# Patient Record
Sex: Female | Born: 1945 | Race: Black or African American | Hispanic: No | Smoking: Never smoker
Health system: Southern US, Community
[De-identification: ages and names within clinical notes are randomized; demographics above are authoritative.]

## PROBLEM LIST (undated history)

## (undated) ENCOUNTER — Emergency Department: Admission: EM | Payer: Medicare Other | Source: Home / Self Care

## (undated) DIAGNOSIS — C801 Malignant (primary) neoplasm, unspecified: Secondary | ICD-10-CM

## (undated) DIAGNOSIS — M1990 Unspecified osteoarthritis, unspecified site: Secondary | ICD-10-CM

## (undated) DIAGNOSIS — I519 Heart disease, unspecified: Secondary | ICD-10-CM

## (undated) DIAGNOSIS — I10 Essential (primary) hypertension: Secondary | ICD-10-CM

## (undated) DIAGNOSIS — E785 Hyperlipidemia, unspecified: Secondary | ICD-10-CM

## (undated) DIAGNOSIS — N189 Chronic kidney disease, unspecified: Secondary | ICD-10-CM

## (undated) HISTORY — PX: OTHER SURGICAL HISTORY: SHX169

## (undated) HISTORY — PX: TUBAL LIGATION: SHX77

---

## 2005-08-26 ENCOUNTER — Ambulatory Visit: Payer: Self-pay | Admitting: Cardiovascular Disease

## 2005-09-29 ENCOUNTER — Ambulatory Visit: Payer: Self-pay | Admitting: Internal Medicine

## 2006-02-24 ENCOUNTER — Ambulatory Visit: Payer: Self-pay | Admitting: Gastroenterology

## 2007-01-31 ENCOUNTER — Ambulatory Visit: Payer: Self-pay | Admitting: Internal Medicine

## 2007-05-10 ENCOUNTER — Emergency Department: Payer: Self-pay | Admitting: Emergency Medicine

## 2008-03-05 ENCOUNTER — Ambulatory Visit: Payer: Self-pay | Admitting: Internal Medicine

## 2009-03-07 ENCOUNTER — Ambulatory Visit: Payer: Self-pay | Admitting: Internal Medicine

## 2009-12-31 ENCOUNTER — Ambulatory Visit: Payer: Self-pay | Admitting: Gastroenterology

## 2010-04-17 ENCOUNTER — Ambulatory Visit: Payer: Self-pay | Admitting: Internal Medicine

## 2011-02-24 ENCOUNTER — Ambulatory Visit: Payer: Self-pay | Admitting: General Practice

## 2011-06-30 ENCOUNTER — Ambulatory Visit: Payer: Self-pay | Admitting: Rheumatology

## 2012-02-29 ENCOUNTER — Ambulatory Visit: Payer: Self-pay | Admitting: General Practice

## 2013-08-21 ENCOUNTER — Encounter: Payer: Self-pay | Admitting: *Deleted

## 2013-09-03 ENCOUNTER — Ambulatory Visit: Payer: Self-pay | Admitting: General Surgery

## 2013-09-05 ENCOUNTER — Ambulatory Visit: Payer: Self-pay | Admitting: General Surgery

## 2013-10-04 ENCOUNTER — Encounter: Payer: Self-pay | Admitting: *Deleted

## 2014-11-12 ENCOUNTER — Other Ambulatory Visit: Payer: Self-pay | Admitting: *Deleted

## 2014-11-12 ENCOUNTER — Encounter: Payer: Self-pay | Admitting: Podiatry

## 2014-11-12 ENCOUNTER — Telehealth: Payer: Self-pay | Admitting: *Deleted

## 2014-11-12 ENCOUNTER — Ambulatory Visit (INDEPENDENT_AMBULATORY_CARE_PROVIDER_SITE_OTHER): Payer: Medicare Other | Admitting: Podiatry

## 2014-11-12 ENCOUNTER — Ambulatory Visit (INDEPENDENT_AMBULATORY_CARE_PROVIDER_SITE_OTHER): Payer: Medicare Other

## 2014-11-12 VITALS — BP 151/83 | HR 73 | Resp 16 | Ht 65.0 in | Wt 213.0 lb

## 2014-11-12 DIAGNOSIS — M7672 Peroneal tendinitis, left leg: Secondary | ICD-10-CM

## 2014-11-12 DIAGNOSIS — B353 Tinea pedis: Secondary | ICD-10-CM

## 2014-11-12 DIAGNOSIS — L84 Corns and callosities: Secondary | ICD-10-CM

## 2014-11-12 DIAGNOSIS — M779 Enthesopathy, unspecified: Secondary | ICD-10-CM

## 2014-11-12 MED ORDER — NAFTIFINE HCL 1 % EX CREA
TOPICAL_CREAM | Freq: Every day | CUTANEOUS | Status: DC
Start: 2014-11-12 — End: 2015-08-27

## 2014-11-12 MED ORDER — LULICONAZOLE 1 % EX CREA: 1.0000 "application " | TOPICAL_CREAM | Freq: Every day | CUTANEOUS | Status: DC

## 2014-11-12 NOTE — Patient Instructions (Addendum)
Peroneal Tendinitis with Rehab Tendonitis is inflammation of a tendon. Inflammation of the tendons on the back of the outer ankle (peroneal tendons) is known as peroneal tendonitis. The peroneal tendons are responsible for connecting the muscles that allow you to stand on your tiptoes to the bones of the ankle. For this reason, peroneal tendonitis often causes pain when trying to complete such motions. Peroneal tendonitis often involves a tear (strain) of the peroneal tendons. Strains are classified into three categories. Grade 1 strains cause pain, but the tendon is not lengthened. Grade 2 strains include a lengthened ligament, due to the ligament being stretched or partially ruptured. With grade 2 strains there is still function, although function may be decreased. Grade 3 strains involve a complete tear of the tendon or muscle, and function is usually impaired. SYMPTOMS   Pain, tenderness, swelling, warmth, or redness over the back of the outer side of the ankle, the outer part of the mid-foot, or the bottom of the arch.  Pain that gets worse with ankle motion (especially when pushing off or pushing down with the front of the foot), or when standing on the ball of the foot or pushing the foot outward.  Crackling sound (crepitation) when the tendon is moved or touched. CAUSES  Peroneal tendinitis occurs when injury to the peroneal tendons causes the body to respond with inflammation. Common causes of injury include:  An overuse injury, in which the groove behind the outer ankle (where the tendon is located) causes wear on the tendon.  A sudden stress placed on the tendon, such as from an increase in the intensity, frequency, or duration of training.  Direct hit (trauma) to the tendon.  Return to activity too soon after a previous ankle injury. RISK INCREASES WITH:  Sports that require sudden, repetitive pushing off of the foot, such as jumping or quick starts.  Kicking and running sports,  especially running down hills or long distances.  Poor strength and flexibility.  Previous injury to the foot, ankle, or leg. PREVENTION  Warm up and stretch properly before activity.  Allow for adequate recovery between workouts.  Maintain physical fitness:  Strength, flexibility, and endurance.  Cardiovascular fitness.  Complete rehabilitation after previous injury. PROGNOSIS  If treated properly, peroneal tendonitis usually heals within 6 weeks.  RELATED COMPLICATIONS  Longer healing time, if not properly treated or if not given enough time to heal.  Recurring symptoms if activity is resumed too soon, with overuse, or when using poor technique.  If untreated, tendinitis may result in tendon rupture, requiring surgery. TREATMENT  Treatment first involves the use of ice and medicine to reduce pain and inflammation. The use of strengthening and stretching exercises may help reduce pain with activity. These exercises may be performed at home or with a therapist. Sometimes, the foot and ankle will be restrained for 10 to 14 days to promote healing. Your caregiver may advise that you place a heel lift in your shoes to reduce the stress placed on the tendon. If nonsurgical treatment is unsuccessful, surgery to remove the inflamed tendon lining (sheath) may be advised.  MEDICATION   If pain medicine is needed, nonsteroidal anti-inflammatory medicines (aspirin and ibuprofen), or other minor pain relievers (acetaminophen), are often advised.  Do not take pain medicine for 7 days before surgery.  Prescription pain relievers may be given, if your caregiver thinks they are needed. Use only as directed and only as much as you need. HEAT AND COLD  Cold treatment (icing) should   be applied for 10 to 15 minutes every 2 to 3 hours for inflammation and pain, and immediately after activity that aggravates your symptoms. Use ice packs or an ice massage.  Heat treatment may be used before  performing stretching and strengthening activities prescribed by your caregiver, physical therapist, or athletic trainer. Use a heat pack or a warm water soak. SEEK MEDICAL CARE IF:  Symptoms get worse or do not improve in 2 to 4 weeks, despite treatment.  New, unexplained symptoms develop. (Drugs used in treatment may produce side effects.) EXERCISES RANGE OF MOTION (ROM) AND STRETCHING EXERCISES - Peroneal Tendinitis These exercises may help you when beginning to rehabilitate your injury. Your symptoms may resolve with or without further involvement from your physician, physical therapist or athletic trainer. While completing these exercises, remember:   Restoring tissue flexibility helps normal motion to return to the joints. This allows healthier, less painful movement and activity.  An effective stretch should be held for at least 30 seconds.  A stretch should never be painful. You should only feel a gentle lengthening or release in the stretched tissue. RANGE OF MOTION - Ankle Eversion  Sit with your right / left ankle crossed over your opposite knee.  Grip your foot with your opposite hand, placing your thumb on the top of your foot and your fingers across the bottom of your foot.  Gently push your foot downward with a slight rotation, so your littlest toes rise slightly toward the ceiling.  You should feel a gentle stretch on the inside of your ankle. Hold the stretch for __________ seconds. Repeat __________ times. Complete this exercise __________ times per day.  RANGE OF MOTION - Ankle Inversion  Sit with your right / left ankle crossed over your opposite knee.  Grip your foot with your opposite hand, placing your thumb on the bottom of your foot and your fingers across the top of your foot.  Gently pull your foot so the smallest toe comes toward you and your thumb pushes the inside of the ball of your foot away from you.  You should feel a gentle stretch on the outside of  your ankle. Hold the stretch for __________ seconds. Repeat __________ times. Complete this exercise __________ times per day.  RANGE OF MOTION - Ankle Plantar Flexion  Sit with your right / left leg crossed over your opposite knee.  Use your opposite hand to pull the top of your foot and toes toward you.  You should feel a gentle stretch on the top of your foot and ankle. Hold this position for __________ seconds. Repeat __________ times. Complete __________ times per day.  STRETCH - Gastroc, Standing  Place your hands on a wall.  Extend your right / left leg behind you, keeping the front knee somewhat bent.  Slightly point your toes inward on your back foot.  Keeping your right / left heel on the floor and your knee straight, shift your weight toward the wall, not allowing your back to arch.  You should feel a gentle stretch in the calf. Hold this position for __________ seconds. Repeat __________ times. Complete this stretch __________ times per day. STRETCH - Soleus, Standing  Place your hands on a wall.  Extend your right / left leg behind you, keeping the other knee somewhat bent.  Slightly point your toes inward on your back foot.  Keep your heel on the floor, bend your back knee, and slightly shift your weight over the back leg so that   you feel a gentle stretch deep in your back calf.  Hold this position for __________ seconds. Repeat __________ times. Complete this stretch __________ times per day. STRETCH - Gastrocsoleus, Standing Note: This exercise can place a lot of stress on your foot and ankle. Please complete this exercise only if specifically instructed by your caregiver.   Place the ball of your right / left foot on a step, keeping your other foot firmly on the same step.  Hold on to the wall or a rail for balance.  Slowly lift your other foot, allowing your body weight to press your heel down over the edge of the step.  You should feel a stretch in your  right / left calf.  Hold this position for __________ seconds.  Repeat this exercise with a slight bend in your knee. Repeat __________ times. Complete this stretch __________ times per day.  STRENGTHENING EXERCISES - Peroneal Tendinitis  These exercises may help you when beginning to rehabilitate your injury. They may resolve your symptoms with or without further involvement from your physician, physical therapist or athletic trainer. While completing these exercises, remember:   Muscles can gain both the endurance and the strength needed for everyday activities through controlled exercises.  Complete these exercises as instructed by your physician, physical therapist or athletic trainer. Increase the resistance and repetitions only as guided by your caregiver. STRENGTH - Dorsiflexors  Secure a rubber exercise band or tubing to a fixed object (table, pole) and loop the other end around your right / left foot.  Sit on the floor facing the fixed object. The band should be slightly tense when your foot is relaxed.  Slowly draw your foot back toward you, using your ankle and toes.  Hold this position for __________ seconds. Slowly release the tension in the band and return your foot to the starting position. Repeat __________ times. Complete this exercise __________ times per day.  STRENGTH - Towel Curls  Sit in a chair, on a non-carpeted surface.  Place your foot on a towel, keeping your heel on the floor.  Pull the towel toward your heel only by curling your toes. Keep your heel on the floor.  If instructed by your physician, physical therapist or athletic trainer, add weight to the end of the towel. Repeat __________ times. Complete this exercise __________ times per day. STRENGTH - Ankle Eversion   Secure one end of a rubber exercise band or tubing to a fixed object (table, pole). Loop the other end around your foot, just before your toes.  Place your fists between your knees.  This will focus your strengthening at your ankle.  Drawing the band across your opposite foot, away from the pole, slowly, pull your little toe out and up. Make sure the band is positioned to resist the entire motion.  Hold this position for __________ seconds.  Have your muscles resist the band, as it slowly pulls your foot back to the starting position. Repeat __________ times. Complete this exercise __________ times per day.  Document Released: 12/06/2005 Document Revised: 04/22/2014 Document Reviewed: 03/20/2009 [facility] Patient Information 2015 [place], [place]. This information is not intended to replace advice given to you by your health care provider. Make sure you discuss any questions you have with your health care provider. Athlete's Foot Athlete's foot (tinea pedis) is a fungal infection of the skin on the feet. It often occurs on the skin between the toes or underneath the toes. It can also occur on the soles of  the feet. Athlete's foot is more likely to occur in hot, humid weather. Not washing your feet or changing your socks often enough can contribute to athlete's foot. The infection can spread from person to person (contagious). CAUSES Athlete's foot is caused by a fungus. This fungus thrives in warm, moist places. Most people get athlete's foot by sharing shower stalls, towels, and wet floors with an infected person. People with weakened immune systems, including those with diabetes, may be more likely to get athlete's foot. SYMPTOMS   Itchy areas between the toes or on the soles of the feet.  White, flaky, or scaly areas between the toes or on the soles of the feet.  Tiny, intensely itchy blisters between the toes or on the soles of the feet.  Tiny cuts on the skin. These cuts can develop a bacterial infection.  Thick or discolored toenails. DIAGNOSIS  Your caregiver can usually tell what the problem is by doing a physical exam. Your caregiver may also take a skin sample  from the rash area. The skin sample may be examined under a microscope, or it may be tested to see if fungus will grow in the sample. A sample may also be taken from your toenail for testing. TREATMENT  Over-the-counter and prescription medicines can be used to kill the fungus. These medicines are available as powders or creams. Your caregiver can suggest medicines for you. Fungal infections respond slowly to treatment. You may need to continue using your medicine for several weeks. PREVENTION   Do not share towels.  Wear sandals in wet areas, such as shared locker rooms and shared showers.  Keep your feet dry. Wear shoes that allow air to circulate. Wear cotton or wool socks. HOME CARE INSTRUCTIONS   Take medicines as directed by your caregiver. Do not use steroid creams on athlete's foot.  Keep your feet clean and cool. Wash your feet daily and dry them thoroughly, especially between your toes.  Change your socks every day. Wear cotton or wool socks. In hot climates, you may need to change your socks 2 to 3 times per day.  Wear sandals or canvas tennis shoes with good air circulation.  If you have blisters, soak your feet in Burow's solution or Epsom salts for 20 to 30 minutes, 2 times a day to dry out the blisters. Make sure you dry your feet thoroughly afterward. SEEK MEDICAL CARE IF:   You have a fever.  You have swelling, soreness, warmth, or redness in your foot.  You are not getting better after 7 days of treatment.  You are not completely cured after 30 days.  You have any problems caused by your medicines. MAKE SURE YOU:   Understand these instructions.  Will watch your condition.  Will get help right away if you are not doing well or get worse. Document Released: 12/03/2000 Document Revised: 02/28/2012 Document Reviewed: 09/24/2011 [facility] Patient Information 2015 [place], [place]. This information is not intended to replace advice given to you by your health care  provider. Make sure you discuss any questions you have with your health care provider.

## 2014-11-12 NOTE — Telephone Encounter (Signed)
You can prescribe naftin cream. Thanks.

## 2014-11-12 NOTE — Telephone Encounter (Signed)
Pt called and stated the luliconazole cream after her ins paid, left her with $167.00 to pay. What would you like to prescribe her next?

## 2014-11-12 NOTE — Progress Notes (Signed)
   Subjective:    Patient ID: Cristina Roberts, female    DOB: April 02, 1946, 68 y.o.   MRN: [id number]  HPI Comments: 68 year old female presents the office today with complaints of cracking/itching to the bottom of her feet. She states that she has tried multiple over-the-counter antifungals without much resolution in symptoms. This has been ongoing for approximately 3 weeks. She has states that she has pain to her left foot intermittently. When asking the localized the pain she points the fifth metatarsal base area. She states that she has pain in this area after prolonged activity/ambulation. There is no pain with direct pressure or shoe gear. She denies any recent injury or trauma to the area. She also states that she has a corn on the right fifth toe. No other complaints at this time.  Foot Pain      Review of Systems  Eyes: Positive for itching.  Musculoskeletal:       Joint pain   All other systems reviewed and are negative.      Objective:   Physical Exam AAO 3, NAD DP/PT pulses palpable bilaterally, CRT less than 3 seconds Protective sensation intact with Simms Weinstein monofilament, vibratory sensation intact, Achilles tendon reflex intact. There is evidence of dry, scaling, pealing skin on the plantar aspect of bilateral feet with slight erythematous base consistent with tinea pedis. There is no open lesions, fissuring, bleeding. No clinical signs of infection. On the right foot on the medial aspect of the fifth digit there is a small hyperkeratotic lesion with underlying adductovarus deformity of the fifth digit. There is no tenderness to palpation overlying the digit. Subjectively the patient has pain to this area with pressure/shoes. On the left foot there is mild tenderness to palpation of the fifth metatarsal base at the insertion of the peroneal tendon. There is no pain with vibratory sensation for overlying edema, erythema, increase in warmth. There is no pain with  range of motion. There is no pain along the course of the peroneal tendons and they appear to be intact. Decrease in medial arch height upon weightbearing. MMT 5/5, ROM WNL No pain with calf compression, swelling, warmth, erythema. No open lesions.       Assessment & Plan:  68 year old female with bilateral tinea pedis, left insertional peroneal tendinitis, right fifth digit hyperkeratotic lesion -Treatment options were discussed including alternatives, risks, complications. -X-rays were obtained and reviewed of the left foot. There is no acute fractures identified. Pain is most likely result of insertional peroneal tendinitis. Discussed with the patient various stretching exercises, ice to the area. Also discussed possible steroid injection to the area however she wishes to hold off at this time. Discussed that symptoms continue can pursue this at next appointment. Anti-inflammatories as needed. -For tinea pedis prescribed luzu cream. Patient later called stating was too expensive there for Naftin was prescribed.  -Hyperkeratotic lesion right medial fifth digit sharply debrided without complications without any underlying ulceration. Dispensed offloading pads. -Follow-up in 4 weeks. In the meantime, call the office with any questions, concerns, change in symptoms.

## 2014-11-13 DIAGNOSIS — B353 Tinea pedis: Secondary | ICD-10-CM | POA: Insufficient documentation

## 2014-11-13 DIAGNOSIS — M7670 Peroneal tendinitis, unspecified leg: Secondary | ICD-10-CM | POA: Insufficient documentation

## 2014-12-17 ENCOUNTER — Ambulatory Visit: Payer: Medicare Other | Admitting: Podiatry

## 2015-01-02 ENCOUNTER — Ambulatory Visit: Payer: Medicare Other | Admitting: Podiatry

## 2015-02-05 ENCOUNTER — Ambulatory Visit: Payer: Self-pay | Admitting: Family Medicine

## 2015-08-27 ENCOUNTER — Encounter
Admission: RE | Admit: 2015-08-27 | Discharge: 2015-08-27 | Disposition: A | Discharge status: Home / Self Care | Payer: Medicare Other | Source: Ambulatory Visit | Attending: Specialist | Admitting: Specialist

## 2015-08-27 DIAGNOSIS — Z01810 Encounter for preprocedural cardiovascular examination: Secondary | ICD-10-CM | POA: Insufficient documentation

## 2015-08-27 DIAGNOSIS — Z01812 Encounter for preprocedural laboratory examination: Secondary | ICD-10-CM | POA: Insufficient documentation

## 2015-08-27 HISTORY — DX: Essential (primary) hypertension: I10

## 2015-08-27 HISTORY — DX: Unspecified osteoarthritis, unspecified site: M19.90

## 2015-08-27 LAB — BASIC METABOLIC PANEL
Anion gap: 7 (ref 5–15)
BUN: 13 mg/dL (ref 6–20)
CHLORIDE: 102 mmol/L (ref 101–111)
CO2: 29 mmol/L (ref 22–32)
Calcium: 9.8 mg/dL (ref 8.9–10.3)
Creatinine, Ser: 0.68 mg/dL (ref 0.44–1.00)
GFR calc Af Amer: >60 mL/min (ref >60)
GFR calc non Af Amer: >60 mL/min (ref >60)
GLUCOSE: 92 mg/dL (ref 65–99)
POTASSIUM: 3.5 mmol/L (ref 3.5–5.1)
Sodium: 138 mmol/L (ref 135–145)

## 2015-08-27 LAB — URINALYSIS COMPLETE WITH MICROSCOPIC (ARMC ONLY)
BILIRUBIN URINE: NEGATIVE
GLUCOSE, UA: NEGATIVE mg/dL
Hgb urine dipstick: NEGATIVE
Ketones, ur: NEGATIVE mg/dL
Leukocytes, UA: NEGATIVE
Nitrite: NEGATIVE
Protein, ur: NEGATIVE mg/dL
Specific Gravity, Urine: 1.018 (ref 1.005–1.030)
pH: 5 (ref 5.0–8.0)

## 2015-08-27 LAB — CBC
HCT: 43.4 % (ref 35.0–47.0)
Hemoglobin: 14.3 g/dL (ref 12.0–16.0)
MCH: 29.8 pg (ref 26.0–34.0)
MCHC: 32.9 g/dL (ref 32.0–36.0)
MCV: 90.4 fL (ref 80.0–100.0)
Platelets: 218 10*3/uL (ref 150–440)
RBC: 4.81 MIL/uL (ref 3.80–5.20)
RDW: 14 % (ref 11.5–14.5)
WBC: 5.9 10*3/uL (ref 3.6–11.0)

## 2015-08-27 LAB — ABO/RH: ABO/RH(D): O POS

## 2015-08-27 LAB — PROTIME-INR
INR: 0.94
Prothrombin Time: 12.8 seconds (ref 11.4–15.0)

## 2015-08-27 LAB — SURGICAL PCR SCREEN
MRSA, PCR: NEGATIVE
STAPHYLOCOCCUS AUREUS: NEGATIVE

## 2015-08-27 LAB — TYPE AND SCREEN
ABO/RH(D): O POS
Antibody Screen: NEGATIVE

## 2015-08-27 NOTE — Patient Instructions (Addendum)
Your procedure is scheduled on: Wednesday 09/10/2015 Report to Day Surgery. 2ND FLOOR MEDICAL MALL ENTRANCE To find out your arrival time please call [phone] between 1PM - 3PM on Tuesday 09/09/2015.  Remember: Instructions that are not followed completely may result in serious medical risk, up to and including death, or upon the discretion of your surgeon and anesthesiologist your surgery may need to be rescheduled.    __X__ 1. Do not eat food or drink liquids after midnight. No gum chewing or hard candies.     __X__ 2. No Alcohol for 24 hours before or after surgery.   ____ 3. Bring all medications with you on the day of surgery if instructed.    __X__ 4. Notify your doctor if there is any change in your medical condition     (cold, fever, infections).     Do not wear jewelry, make-up, hairpins, clips or nail polish.  Do not wear lotions, powders, or perfumes.  Do not shave 48 hours prior to surgery. Men may shave face and neck.  Do not bring valuables to the hospital.    [facility] is not responsible for any belongings or valuables.               Contacts, dentures or bridgework may not be worn into surgery.  Leave your suitcase in the car. After surgery it may be brought to your room.  For patients admitted to the hospital, discharge time is determined by your                treatment team.   Patients discharged the day of surgery will not be allowed to drive home.   Please read over the following fact sheets that you were given:   MRSA Information and Surgical Site Infection Prevention   __X__ Take these medicines the morning of surgery with A SIP OF WATER:    1. AMLODIPINE  2.   3.   4.  5.  6.  ____ Fleet Enema (as directed)   __X__ Use CHG Soap as directed  ____ Use inhalers on the day of surgery  ____ Stop metformin 2 days prior to surgery    ____ Take 1/2 of usual insulin dose the night before surgery and none on the morning of surgery.   ____ Stop  Coumadin/Plavix/aspirin on   __X__ Stop Anti-inflammatories on 09/03/2015 STOP YOUR DICLOFENAC, YOU MAY SUBSTITUTE TYLENOL  FOR ACHES OR PAIN   ____ Stop supplements until after surgery.    ____ Bring C-Pap to the hospital.   Spinal Anesthesia Anesthesia is medicine to make you comfortable during surgery or a procedure. There are many types of anesthesia. Spinal anesthesia is performed when a medication is injected into the fluid surrounding the spinal cord. This deadens the nerves coming out of the spinal cord from the level the needle is inserted on down. In other words, you temporarily will not be able to move your legs or feel them. You may be made numb from your nipples on down. How far up you are numb depends on the type of surgery being done. Spinal anesthesia begins working almost immediately after the injection of a medication into the spinal canal. Epidural anesthesia takes 10-20 minutes to begin working. The only discomfort from this type of anesthesia is the little stick of the needle into your back to put the medicine into the spinal canal. This is really not much different than having your blood drawn. REASON FOR RECEIVING SPINAL OR EPIDURAL  ANESTHESIA  Spinal anesthesia is often used in surgeries of the pelvis, hips, and legs and lower abdomen.  Epidural anesthesia is used for pain relief during childbirth and after major abdominal or chest surgery. ADVANTAGES OF SPINAL AND EPIDURAL ANESTHESIA INCLUDE:  The ability to be awake but without discomfort during the operation.  To avoid problems of general anesthesia such as:  Nausea.  Vomiting.  Sore throat.  Less blood loss during surgery.  Better pain control after surgery.  Lower incidence of lower extremity blood clots (DVTs) following surgery. RISK FACTORS THAT MAY CAUSE PROBLEMS  If you have a bleeding disorder or are on blood-thinning medicines.  If you have had previous bad or allergic reactions to  anesthetics.  If you have an immune system disorder.  If you have a localized infection on your lower back or a systemic infections throughout your body. LET YOUR CAREGIVER KNOW ABOUT:  Your drug allergies.  The medications you are taking. Include herbal, vitamin supplements and all over-the-counter medications.  Any medical problems you have including heart or lung conditions.  Any use of steroids.  Any recent alcohol, cigarette or illicit drug use.  Any previous reactions that you or other family members have had to anesthesia (especially to local anesthetics).  Any bleeding or clotting problems you have had in the past. RISKS AND COMPLICATIONS  Other than a spinal headache, bad reactions to spinal anesthesia are uncommon. Your caregiver can discuss these with you. Some complications may include:  Severe headache.  Drop in blood pressure which could lead to heart attack or stroke.  Nerve damage (extremely uncommon).  Infection.  Allergic reaction to the anesthetic used.  Seizures.  If the anesthesia goes so high it paralyzes your respiratory muscles you may be unable to breathe on your own. If this happens, your caregiver may have to put a tube into your trachea (the main breathing tube into the lungs), and put you on a machine to breathe for you until the anesthetic wears off.  You could have long-lasting numbness, pain or loss of function of body parts. PROCEDURE   You will be connected to various monitors to keep track of your blood pressure, pulse, and the amount of oxygen in your blood.  Caregivers will either have you lie on your side with your knees and chin bent toward your chest, or may have you sit up on the bed or table. This position opens up the spaces between your back bones and makes it easier for the needle to be placed.  Prior to getting spinal or epidural anesthesia, the area of your back where the medicine is injected will be cleaned with a soap-like  solution. You may be given an injection of local anesthetic directly over the spot where the spinal or epidural anesthetic will be given, to decrease discomfort from the needle used to give the spinal or epidural anesthetic.  A needle is put between the bones of your back. Stay as still and quiet as you can and continue to breathe normally. Tell your anesthesia caregiver if you feel a tingling shock or pain going down a leg, but try not to move.  When your caregiver has the needle in the right place, medicine is injected. Spinal anesthesia involves a single injection of medication into the sac of fluid that surrounds your spinal cord.  Spinal and epidural anesthesia may cause low blood pressure. An IV (intravenous line) is usually started to give fluids and treat blood pressure problems if they  show up during the procedure. You may also be given medications though this line to relax you before the anesthetic is given.  Depending on how long the surgery will take, you may have a drainage tube(Foley catheter) put into your bladder to keep your urine drained.  Epidural anesthesia may be given as a single injection just outside of the sac of fluid that surrounds your spinal cord. When more than one dose of epidural anesthesia might be required, the anesthetist will leave a tiny, flexible tube or catheter in place outside of the fluid sac surrounding your spinal cord. With this catheter in place, more anesthetic can be given easily if the operation takes longer than expected. Some surgical centers leave the epidural tube in place for 24 hours or more after surgery, using it to give pain medications during the immediate post-surgical period.  After the injection of spinal or epidural anesthesia has been completed, or after the epidural catheter has been removed, a small bandage will be placed over the area where the needle was removed. AFTER THE PROCEDURE   After spinal anesthesia, you will be kept in bed  for several hours.  You will be kept in bed until your legs are no longer numb and it is considered safe for you to walk. This numbness usually wears off in about one to four hours. This depends on the medication and dose used.  The length of time you stay in the hospital will depend on the surgery you are having or have had.  If you received epidural anesthesia, and you need pain medicine after surgery, the epidural catheter may be left in place in order to continue supplying small doses of numbing anesthesia. The epidural catheter is removed when it is no longer needed. HOME CARE INSTRUCTIONS  Do not drive or operate machinery for at least 24 hours after anesthesia. Make sure someone can drive you home.  Do not drink alcohol for at least 24 hours after receiving anesthesia.  Do not make important decisions for 24 hours after having spinal or epidural anesthesia. Your thinking may not be clear.  Have someone stay with you for at least 24 to 48 hours following surgery.  Drink lots of fluids when you get home. If you are an adult, drink eight, 8 ounce glasses of water every day, or as directed. SEEK IMMEDIATE MEDICAL CARE IF:  You have a fever or any temperature over 98.6 F (37 C).  You have persistent or severe headaches.  You have dizziness, fainting or lightheadedness.  You have weakness, numbness, or tingling in your arms or legs.  You have a skin rash.  You have difficulty breathing.  You have persistent nausea & vomiting.  You are unable to pass urine. Document Released: 02/26/2004 Document Revised: 02/28/2012 Document Reviewed: 01/09/2008 [facility] Patient Information 2015 [place], [place]. This information is not intended to replace advice given to you by your health care provider. Make sure you discuss any questions you have with your health care provider.

## 2015-08-28 NOTE — OR Nursing (Signed)
Faxed UA results to Dr Ammie Ferrier office for review

## 2015-09-01 NOTE — OR Nursing (Signed)
ekg ok by Dr Gildardo Griffes

## 2015-09-09 NOTE — H&P (Signed)
Cristina Roberts is an 69 y.o. female.   Chief Complaint: Right hip pain HPI: This 69 y/o female has been treated for severe right hip arthritis for several years with medication, exercise, and rest as needed.  She is unable to continue working as she wishes and has pain with activity and at rest.  X-rays show severe OA right hip with loss of joint space, sclerosis, and spurring.She desires right THR to relieve this. The risks and benefits and post op protocols have been discussed in depth with her and she wishes to proceed.   Past Medical History  Diagnosis Date  . Hypertension   . Arthritis     Past Surgical History  Procedure Laterality Date  . Fatty tumor removed    . Tubal ligation      No family history on file. Social History:  reports that she has never smoked. She has never used smokeless tobacco. She reports that she does not drink alcohol or use illicit drugs.  Allergies: No Known Allergies  No prescriptions prior to admission    No results found for this or any previous visit (from the past 48 hour(s)). No results found.  Review of Systems  Constitutional: Negative.   HENT: Negative.   Eyes: Negative.   Respiratory: Negative.   Cardiovascular: Negative.   Gastrointestinal: Negative.   Genitourinary: Negative.   Musculoskeletal: Positive for joint pain.  Skin: Negative.   Neurological: Negative.   Endo/Heme/Allergies: Negative.   Psychiatric/Behavioral: Negative.     There were no vitals taken for this visit. Physical Exam  Constitutional: She appears well-developed and well-nourished.  HENT:  Head: Normocephalic.  Eyes: Pupils are equal, round, and reactive to light.  Neck: Normal range of motion. Neck supple.  Cardiovascular: Normal rate and regular rhythm.   Respiratory: Effort normal.  GI: Soft.  Musculoskeletal:       Right hip: She exhibits decreased range of motion, decreased strength and tenderness.  Neurological: She is alert. She has normal  reflexes.  Skin: Skin is warm and intact.  Psychiatric: She has a normal mood and affect. Her speech is normal and behavior is normal.     Assessment/Plan Advanced right hip arthritis:  Plan total hip replacement  MILLER,HOWARD E 09/09/2015, 4:26 PM

## 2015-09-10 ENCOUNTER — Inpatient Hospital Stay: Payer: Medicare Other

## 2015-09-10 ENCOUNTER — Inpatient Hospital Stay
Admission: RE | Admit: 2015-09-10 | Discharge: 2015-09-13 | DRG: 470 | Disposition: A | Discharge status: Skilled Nursing Facility | Payer: Medicare Other | Source: Ambulatory Visit | Attending: Specialist | Admitting: Specialist

## 2015-09-10 ENCOUNTER — Encounter
Admission: RE | Disposition: A | Discharge status: Skilled Nursing Facility | Payer: Self-pay | Source: Ambulatory Visit | Attending: Specialist

## 2015-09-10 ENCOUNTER — Inpatient Hospital Stay: Payer: Medicare Other | Admitting: Anesthesiology

## 2015-09-10 ENCOUNTER — Encounter: Payer: Self-pay | Admitting: *Deleted

## 2015-09-10 DIAGNOSIS — E669 Obesity, unspecified: Secondary | ICD-10-CM | POA: Diagnosis present

## 2015-09-10 DIAGNOSIS — I10 Essential (primary) hypertension: Secondary | ICD-10-CM | POA: Diagnosis present

## 2015-09-10 DIAGNOSIS — M1611 Unilateral primary osteoarthritis, right hip: Secondary | ICD-10-CM | POA: Diagnosis present

## 2015-09-10 DIAGNOSIS — Z6836 Body mass index (BMI) 36.0-36.9, adult: Secondary | ICD-10-CM

## 2015-09-10 DIAGNOSIS — Z96649 Presence of unspecified artificial hip joint: Secondary | ICD-10-CM

## 2015-09-10 HISTORY — PX: TOTAL HIP ARTHROPLASTY: SHX124

## 2015-09-10 LAB — CREATININE, SERUM
CREATININE: 0.74 mg/dL (ref 0.44–1.00)
GFR calc non Af Amer: >60 mL/min (ref >60)

## 2015-09-10 LAB — CBC
HCT: 40.6 % (ref 35.0–47.0)
HEMOGLOBIN: 13.5 g/dL (ref 12.0–16.0)
MCH: 30.3 pg (ref 26.0–34.0)
MCHC: 33.2 g/dL (ref 32.0–36.0)
MCV: 91.2 fL (ref 80.0–100.0)
PLATELETS: 200 10*3/uL (ref 150–440)
RBC: 4.45 MIL/uL (ref 3.80–5.20)
RDW: 13.9 % (ref 11.5–14.5)
WBC: 14.8 10*3/uL — AB (ref 3.6–11.0)

## 2015-09-10 SURGERY — ARTHROPLASTY, HIP, TOTAL,POSTERIOR APPROACH
Anesthesia: Spinal | Site: Hip | Laterality: Right | Wound class: Clean

## 2015-09-10 MED ORDER — CEFAZOLIN SODIUM-DEXTROSE 2-3 GM-% IV SOLR
INTRAVENOUS | Status: AC
  Filled 2015-09-10: qty 50

## 2015-09-10 MED ORDER — NEOMYCIN-POLYMYXIN B GU 40-200000 IR SOLN
Status: DC | PRN
  Administered 2015-09-10: 16 mL

## 2015-09-10 MED ORDER — CELECOXIB 200 MG PO CAPS
400.0000 mg | ORAL_CAPSULE | Freq: Once | ORAL | Status: AC
  Administered 2015-09-10: 400 mg via ORAL

## 2015-09-10 MED ORDER — FLEET ENEMA 7-19 GM/118ML RE ENEM: 1.0000 | ENEMA | Freq: Once | RECTAL | Status: DC | PRN

## 2015-09-10 MED ORDER — HYDROMORPHONE HCL 1 MG/ML IJ SOLN
0.5000 mg | INTRAMUSCULAR | Status: DC | PRN
  Administered 2015-09-10: 0.5 mg via INTRAVENOUS

## 2015-09-10 MED ORDER — FAMOTIDINE 20 MG PO TABS
20.0000 mg | ORAL_TABLET | Freq: Once | ORAL | Status: AC
  Administered 2015-09-10: 20 mg via ORAL

## 2015-09-10 MED ORDER — METHOCARBAMOL 1000 MG/10ML IJ SOLN: 500.0000 mg | Freq: Four times a day (QID) | INTRAVENOUS | Status: DC | PRN

## 2015-09-10 MED ORDER — FAMOTIDINE 20 MG PO TABS
ORAL_TABLET | ORAL | Status: AC
  Administered 2015-09-10: 20 mg via ORAL
  Filled 2015-09-10: qty 1

## 2015-09-10 MED ORDER — MIDAZOLAM HCL 5 MG/5ML IJ SOLN
INTRAMUSCULAR | Status: DC | PRN
Start: 2015-09-10 — End: 2015-09-10
  Administered 2015-09-10 (×2): 1 mg via INTRAVENOUS

## 2015-09-10 MED ORDER — HYDROMORPHONE HCL 1 MG/ML IJ SOLN
INTRAMUSCULAR | Status: AC
  Administered 2015-09-10: 0.5 mg via INTRAVENOUS
  Filled 2015-09-10: qty 1

## 2015-09-10 MED ORDER — SODIUM CHLORIDE 0.9 % IJ SOLN
INTRAMUSCULAR | Status: AC
  Filled 2015-09-10: qty 50

## 2015-09-10 MED ORDER — METOCLOPRAMIDE HCL 5 MG/ML IJ SOLN
5.0000 mg | Freq: Three times a day (TID) | INTRAMUSCULAR | Status: DC | PRN
Start: 2015-09-10 — End: 2015-09-13

## 2015-09-10 MED ORDER — ACETAMINOPHEN 325 MG PO TABS: 650.0000 mg | ORAL_TABLET | Freq: Four times a day (QID) | ORAL | Status: DC | PRN

## 2015-09-10 MED ORDER — MORPHINE SULFATE (PF) 2 MG/ML IV SOLN
2.0000 mg | INTRAVENOUS | Status: DC | PRN
Start: 2015-09-10 — End: 2015-09-13
  Administered 2015-09-10: 2 mg via INTRAVENOUS
  Filled 2015-09-10: qty 1

## 2015-09-10 MED ORDER — ZOLPIDEM TARTRATE 5 MG PO TABS: 5.0000 mg | ORAL_TABLET | Freq: Every evening | ORAL | Status: DC | PRN

## 2015-09-10 MED ORDER — FENTANYL CITRATE (PF) 100 MCG/2ML IJ SOLN
INTRAMUSCULAR | Status: DC | PRN
  Administered 2015-09-10 (×2): 25 ug via INTRAVENOUS

## 2015-09-10 MED ORDER — PREGABALIN 75 MG PO CAPS
ORAL_CAPSULE | ORAL | Status: AC
  Administered 2015-09-10: 75 mg via ORAL
  Filled 2015-09-10: qty 1

## 2015-09-10 MED ORDER — CLINDAMYCIN PHOSPHATE 600 MG/50ML IV SOLN
INTRAVENOUS | Status: AC
  Filled 2015-09-10: qty 50

## 2015-09-10 MED ORDER — ACETAMINOPHEN 500 MG PO TABS: 1000.0000 mg | ORAL_TABLET | Freq: Four times a day (QID) | ORAL | Status: DC

## 2015-09-10 MED ORDER — DICLOFENAC SODIUM 75 MG PO TBEC: 75.0000 mg | DELAYED_RELEASE_TABLET | Freq: Two times a day (BID) | ORAL | Status: DC

## 2015-09-10 MED ORDER — ONDANSETRON HCL 4 MG/2ML IJ SOLN: 4.0000 mg | Freq: Once | INTRAMUSCULAR | Status: DC | PRN

## 2015-09-10 MED ORDER — HYDROCHLOROTHIAZIDE 25 MG PO TABS
25.0000 mg | ORAL_TABLET | Freq: Every day | ORAL | Status: DC
  Administered 2015-09-10 – 2015-09-13 (×3): 25 mg via ORAL
  Filled 2015-09-10 (×3): qty 1

## 2015-09-10 MED ORDER — ACETAMINOPHEN 650 MG RE SUPP: 650.0000 mg | Freq: Four times a day (QID) | RECTAL | Status: DC | PRN

## 2015-09-10 MED ORDER — TRANEXAMIC ACID 1000 MG/10ML IV SOLN
1000.0000 mg | INTRAVENOUS | Status: AC
  Administered 2015-09-10: 1000 mg via INTRAVENOUS

## 2015-09-10 MED ORDER — BENAZEPRIL HCL 20 MG PO TABS
20.0000 mg | ORAL_TABLET | Freq: Every day | ORAL | Status: DC
  Administered 2015-09-10 – 2015-09-13 (×3): 20 mg via ORAL
  Filled 2015-09-10 (×3): qty 1

## 2015-09-10 MED ORDER — BUPIVACAINE-EPINEPHRINE (PF) 0.5% -1:200000 IJ SOLN
INTRAMUSCULAR | Status: AC
  Filled 2015-09-10: qty 30

## 2015-09-10 MED ORDER — BUPIVACAINE-EPINEPHRINE (PF) 0.25% -1:200000 IJ SOLN
INTRAMUSCULAR | Status: AC
  Filled 2015-09-10: qty 30

## 2015-09-10 MED ORDER — SODIUM CHLORIDE 0.9 % IJ SOLN
INTRAMUSCULAR | Status: AC
  Filled 2015-09-10: qty 3

## 2015-09-10 MED ORDER — CALCIUM CARBONATE-VITAMIN D 500-200 MG-UNIT PO TABS
1.0000 | ORAL_TABLET | Freq: Every day | ORAL | Status: DC
  Administered 2015-09-10 – 2015-09-13 (×4): 1 via ORAL
  Filled 2015-09-10 (×4): qty 1

## 2015-09-10 MED ORDER — AMLODIPINE BESYLATE 5 MG PO TABS
5.0000 mg | ORAL_TABLET | Freq: Every day | ORAL | Status: DC
  Administered 2015-09-12 – 2015-09-13 (×2): 5 mg via ORAL
  Filled 2015-09-10 (×2): qty 1

## 2015-09-10 MED ORDER — SENNA 8.6 MG PO TABS
1.0000 | ORAL_TABLET | Freq: Two times a day (BID) | ORAL | Status: DC
  Administered 2015-09-10 – 2015-09-13 (×7): 8.6 mg via ORAL
  Filled 2015-09-10 (×7): qty 1

## 2015-09-10 MED ORDER — ALUM & MAG HYDROXIDE-SIMETH 200-200-20 MG/5ML PO SUSP: 30.0000 mL | ORAL | Status: DC | PRN

## 2015-09-10 MED ORDER — FENTANYL CITRATE (PF) 100 MCG/2ML IJ SOLN
INTRAMUSCULAR | Status: AC
  Administered 2015-09-10: 25 ug via INTRAVENOUS
  Filled 2015-09-10: qty 2

## 2015-09-10 MED ORDER — BISACODYL 10 MG RE SUPP: 10.0000 mg | Freq: Every day | RECTAL | Status: DC | PRN

## 2015-09-10 MED ORDER — CELECOXIB 200 MG PO CAPS
ORAL_CAPSULE | ORAL | Status: AC
  Administered 2015-09-10: 400 mg via ORAL
  Filled 2015-09-10: qty 2

## 2015-09-10 MED ORDER — TRANEXAMIC ACID 1000 MG/10ML IV SOLN
INTRAVENOUS | Status: AC
  Filled 2015-09-10: qty 10

## 2015-09-10 MED ORDER — METHOCARBAMOL 500 MG PO TABS: 500.0000 mg | ORAL_TABLET | Freq: Four times a day (QID) | ORAL | Status: DC | PRN

## 2015-09-10 MED ORDER — NEOMYCIN-POLYMYXIN B GU 40-200000 IR SOLN
Status: AC
Start: 2015-09-10 — End: 2015-09-10
  Filled 2015-09-10: qty 20

## 2015-09-10 MED ORDER — CLINDAMYCIN PHOSPHATE 600 MG/50ML IV SOLN
600.0000 mg | Freq: Three times a day (TID) | INTRAVENOUS | Status: AC
  Administered 2015-09-10 – 2015-09-11 (×3): 600 mg via INTRAVENOUS
  Filled 2015-09-10 (×3): qty 50

## 2015-09-10 MED ORDER — MAGNESIUM HYDROXIDE 400 MG/5ML PO SUSP: 30.0000 mL | Freq: Every day | ORAL | Status: DC | PRN

## 2015-09-10 MED ORDER — PREGABALIN 75 MG PO CAPS
75.0000 mg | ORAL_CAPSULE | Freq: Once | ORAL | Status: AC
  Administered 2015-09-10: 75 mg via ORAL

## 2015-09-10 MED ORDER — ONDANSETRON HCL 4 MG/2ML IJ SOLN
4.0000 mg | Freq: Four times a day (QID) | INTRAMUSCULAR | Status: DC | PRN
  Administered 2015-09-10: 4 mg via INTRAVENOUS
  Filled 2015-09-10: qty 2

## 2015-09-10 MED ORDER — TRANEXAMIC ACID 1000 MG/10ML IV SOLN
1000.0000 mg | INTRAVENOUS | Status: AC
  Administered 2015-09-10: 1000 mg via INTRAVENOUS
  Filled 2015-09-10: qty 10

## 2015-09-10 MED ORDER — BUPIVACAINE LIPOSOME 1.3 % IJ SUSP
INTRAMUSCULAR | Status: AC
  Filled 2015-09-10: qty 20

## 2015-09-10 MED ORDER — HYDROCODONE-ACETAMINOPHEN 10-325 MG PO TABS
1.0000 | ORAL_TABLET | ORAL | Status: DC | PRN
  Administered 2015-09-10 – 2015-09-11 (×3): 1 via ORAL
  Administered 2015-09-11 (×2): 2 via ORAL
  Administered 2015-09-11 – 2015-09-12 (×4): 1 via ORAL
  Administered 2015-09-13 (×2): 2 via ORAL
  Filled 2015-09-10: qty 1
  Filled 2015-09-10 (×2): qty 2
  Filled 2015-09-10 (×4): qty 1
  Filled 2015-09-10 (×2): qty 2
  Filled 2015-09-10 (×3): qty 1

## 2015-09-10 MED ORDER — ACETAMINOPHEN 500 MG PO TABS: 1000.0000 mg | ORAL_TABLET | Freq: Four times a day (QID) | ORAL | Status: DC | PRN

## 2015-09-10 MED ORDER — ENOXAPARIN SODIUM 40 MG/0.4ML ~~LOC~~ SOLN
40.0000 mg | SUBCUTANEOUS | Status: DC
  Administered 2015-09-11 – 2015-09-13 (×3): 40 mg via SUBCUTANEOUS
  Filled 2015-09-10 (×3): qty 0.4

## 2015-09-10 MED ORDER — BENAZEPRIL-HYDROCHLOROTHIAZIDE 20-25 MG PO TABS: 1.0000 | ORAL_TABLET | Freq: Every day | ORAL | Status: DC

## 2015-09-10 MED ORDER — FERROUS SULFATE 325 (65 FE) MG PO TABS
325.0000 mg | ORAL_TABLET | Freq: Every day | ORAL | Status: DC
  Administered 2015-09-11 – 2015-09-13 (×3): 325 mg via ORAL
  Filled 2015-09-10 (×3): qty 1

## 2015-09-10 MED ORDER — CELECOXIB 200 MG PO CAPS
200.0000 mg | ORAL_CAPSULE | Freq: Two times a day (BID) | ORAL | Status: DC
  Administered 2015-09-10 – 2015-09-13 (×6): 200 mg via ORAL
  Filled 2015-09-10 (×7): qty 1

## 2015-09-10 MED ORDER — BUPIVACAINE-EPINEPHRINE (PF) 0.25% -1:200000 IJ SOLN
INTRAMUSCULAR | Status: DC | PRN
  Administered 2015-09-10: 30 mL

## 2015-09-10 MED ORDER — DIPHENHYDRAMINE HCL 12.5 MG/5ML PO ELIX: 12.5000 mg | ORAL_SOLUTION | ORAL | Status: DC | PRN

## 2015-09-10 MED ORDER — SODIUM CHLORIDE 0.45 % IV SOLN
INTRAVENOUS | Status: DC
  Administered 2015-09-10 – 2015-09-11 (×2): via INTRAVENOUS

## 2015-09-10 MED ORDER — LACTATED RINGERS IV SOLN
INTRAVENOUS | Status: DC
  Administered 2015-09-10: 07:00:00 via INTRAVENOUS

## 2015-09-10 MED ORDER — METOCLOPRAMIDE HCL 5 MG PO TABS: 5.0000 mg | ORAL_TABLET | Freq: Three times a day (TID) | ORAL | Status: DC | PRN

## 2015-09-10 MED ORDER — PROPOFOL INFUSION 10 MG/ML OPTIME
INTRAVENOUS | Status: DC | PRN
  Administered 2015-09-10: 25 ug/kg/min via INTRAVENOUS

## 2015-09-10 MED ORDER — SODIUM CHLORIDE 0.9 % IV SOLN
10000.0000 ug | INTRAVENOUS | Status: DC | PRN
  Administered 2015-09-10: 10 ug/min via INTRAVENOUS

## 2015-09-10 MED ORDER — FENTANYL CITRATE (PF) 100 MCG/2ML IJ SOLN
25.0000 ug | INTRAMUSCULAR | Status: AC | PRN
  Administered 2015-09-10 (×6): 25 ug via INTRAVENOUS

## 2015-09-10 MED ORDER — MENTHOL 3 MG MT LOZG: 1.0000 | LOZENGE | OROMUCOSAL | Status: DC | PRN

## 2015-09-10 MED ORDER — PREGABALIN 75 MG PO CAPS
75.0000 mg | ORAL_CAPSULE | Freq: Two times a day (BID) | ORAL | Status: DC
  Administered 2015-09-10 – 2015-09-13 (×7): 75 mg via ORAL
  Filled 2015-09-10 (×7): qty 1

## 2015-09-10 MED ORDER — CEFAZOLIN SODIUM-DEXTROSE 2-3 GM-% IV SOLR
2.0000 g | Freq: Four times a day (QID) | INTRAVENOUS | Status: AC
  Administered 2015-09-10 (×2): 2 g via INTRAVENOUS
  Filled 2015-09-10 (×3): qty 50

## 2015-09-10 MED ORDER — CEFAZOLIN SODIUM-DEXTROSE 2-3 GM-% IV SOLR
2.0000 g | Freq: Once | INTRAVENOUS | Status: AC
  Administered 2015-09-10: 2 g via INTRAVENOUS

## 2015-09-10 MED ORDER — PHENYLEPHRINE HCL 10 MG/ML IJ SOLN
INTRAMUSCULAR | Status: DC | PRN
  Administered 2015-09-10 (×5): 100 ug via INTRAVENOUS

## 2015-09-10 MED ORDER — BUPIVACAINE IN DEXTROSE 0.75-8.25 % IT SOLN
INTRATHECAL | Status: DC | PRN
  Administered 2015-09-10: 12.5 mL via INTRATHECAL

## 2015-09-10 MED ORDER — CLINDAMYCIN PHOSPHATE 600 MG/50ML IV SOLN
600.0000 mg | Freq: Once | INTRAVENOUS | Status: AC
  Administered 2015-09-10: 600 mg via INTRAVENOUS

## 2015-09-10 MED ORDER — PHENOL 1.4 % MT LIQD: 1.0000 | OROMUCOSAL | Status: DC | PRN

## 2015-09-10 MED ORDER — HYDROCODONE-ACETAMINOPHEN 7.5-325 MG PO TABS: 1.0000 | ORAL_TABLET | Freq: Four times a day (QID) | ORAL | Status: DC

## 2015-09-10 MED ORDER — BUPIVACAINE LIPOSOME 1.3 % IJ SUSP
INTRAMUSCULAR | Status: DC | PRN
  Administered 2015-09-10: 60 mL

## 2015-09-10 MED ORDER — ONDANSETRON HCL 4 MG PO TABS: 4.0000 mg | ORAL_TABLET | Freq: Four times a day (QID) | ORAL | Status: DC | PRN

## 2015-09-10 SURGICAL SUPPLY — 55 items
AUTOTRANSFUS HAS 1/8 (MISCELLANEOUS) ×3
BAG COUNTER SPONGE EZ (MISCELLANEOUS) IMPLANT
BLADE DEBAKEY 8.0 (BLADE) ×2 IMPLANT
BLADE DEBAKEY 8.0MM (BLADE) ×1
BLADE SAGITTAL WIDE XTHICK NO (BLADE) ×3 IMPLANT
BRUSH STANDARD PREP 14M (MISCELLANEOUS) IMPLANT
BUR EGG ELITE 5.0 (BURR) ×2 IMPLANT
BUR EGG ELITE 5.0MM (BURR) ×1
CANISTER SUCT 1200ML W/VALVE (MISCELLANEOUS) ×3 IMPLANT
CANISTER SUCT 3000ML (MISCELLANEOUS) ×3 IMPLANT
CAPT HIP TOTAL 2 ×3 IMPLANT
CATH TRAY METER 16FR LF (MISCELLANEOUS) ×3 IMPLANT
CHLORAPREP W/TINT 26ML (MISCELLANEOUS) ×6 IMPLANT
COUNTER SPONGE BAG EZ (MISCELLANEOUS)
DRAPE INCISE IOBAN 66X60 STRL (DRAPES) ×3 IMPLANT
DRAPE SHEET LG 3/4 BI-LAMINATE (DRAPES) IMPLANT
DRAPE TABLE BACK 80X90 (DRAPES) ×3 IMPLANT
DRSG AQUACEL AG ADV 3.5X10 (GAUZE/BANDAGES/DRESSINGS) IMPLANT
DRSG AQUACEL AG ADV 3.5X14 (GAUZE/BANDAGES/DRESSINGS) ×3 IMPLANT
ELECT BLADE 6 FLAT ULTRCLN (ELECTRODE) ×3 IMPLANT
GAUZE PETRO XEROFOAM 1X8 (MISCELLANEOUS) ×3 IMPLANT
GAUZE SPONGE 4X4 12PLY STRL (GAUZE/BANDAGES/DRESSINGS) ×3 IMPLANT
GLOVE INDICATOR 8.0 STRL GRN (GLOVE) ×3 IMPLANT
GLOVE SURG ORTHO 8.5 STRL (GLOVE) ×3 IMPLANT
GOWN STRL REUS W/ TWL LRG LVL3 (GOWN DISPOSABLE) ×2 IMPLANT
GOWN STRL REUS W/ TWL XL LVL3 (GOWN DISPOSABLE) ×1 IMPLANT
GOWN STRL REUS W/TWL LRG LVL3 (GOWN DISPOSABLE) ×4
GOWN STRL REUS W/TWL XL LVL3 (GOWN DISPOSABLE) ×2
HANDPIECE SUCTION TUBG SURGILV (MISCELLANEOUS) ×3 IMPLANT
KIT RM TURNOVER STRD PROC AR (KITS) ×3 IMPLANT
NEEDLE SPNL 18GX3.5 QUINCKE PK (NEEDLE) ×3 IMPLANT
NS IRRIG 1000ML POUR BTL (IV SOLUTION) ×3 IMPLANT
PACK HIP PROSTHESIS (MISCELLANEOUS) ×3 IMPLANT
PAD GROUND ADULT SPLIT (MISCELLANEOUS) ×3 IMPLANT
PAD PREP 24X41 OB/GYN DISP (PERSONAL CARE ITEMS) ×3 IMPLANT
PIN STEIN THRED 5/32 (Pin) ×3 IMPLANT
SLEEVE CABLE 2MM VT (Orthopedic Implant) ×3 IMPLANT
SOL .9 NS 3000ML IRR  AL (IV SOLUTION) ×2
SOL .9 NS 3000ML IRR UROMATIC (IV SOLUTION) ×1 IMPLANT
SPONGE LAP 18X18 5 PK (GAUZE/BANDAGES/DRESSINGS) IMPLANT
STAPLER SKIN PROX 35W (STAPLE) ×3 IMPLANT
SUT BONE WAX W31G (SUTURE) ×3 IMPLANT
SUT DVC 2 QUILL PDO  T11 36X36 (SUTURE) ×2
SUT DVC 2 QUILL PDO T11 36X36 (SUTURE) ×1 IMPLANT
SUT ETHIBOND NAB CT1 #1 30IN (SUTURE) ×3 IMPLANT
SUT QUILL PDO 0 36 36 VIOLET (SUTURE) ×3 IMPLANT
SUT TICRON 2-0 30IN 311381 (SUTURE) ×12 IMPLANT
SUT VIC AB 2-0 CT1 27 (SUTURE) ×4
SUT VIC AB 2-0 CT1 TAPERPNT 27 (SUTURE) ×2 IMPLANT
SYR 20CC LL (SYRINGE) ×3 IMPLANT
SYR 30ML LL (SYRINGE) ×3 IMPLANT
SYSTEM AUTOTRANSFUS DUAL TROCR (MISCELLANEOUS) ×1 IMPLANT
TAPE TRANSPORE STRL 2 31045 (GAUZE/BANDAGES/DRESSINGS) IMPLANT
TUBE SUCT KAM VAC (TUBING) ×3 IMPLANT
WATER STERILE IRR 1000ML POUR (IV SOLUTION) ×3 IMPLANT

## 2015-09-10 NOTE — Anesthesia Procedure Notes (Signed)
Spinal Patient location during procedure: OR Staffing Anesthesiologist: Gunnar Bulla Performed by: anesthesiologist  Preanesthetic Checklist Completed: patient identified, site marked, surgical consent, pre-op evaluation, timeout performed, IV checked and risks and benefits discussed Spinal Block Patient position: sitting Prep: Betadine Patient monitoring: heart rate, cardiac monitor, continuous pulse ox and blood pressure Approach: midline Location: L3-4 Injection technique: single-shot Needle Needle type: Pencil-Tip  Needle gauge: 25 G Needle length: 9 cm Assessment Sensory level: T10 Additional Notes 0750 marcsine 12.5mg .

## 2015-09-10 NOTE — H&P (Signed)
THE PATIENT WAS SEEN IN THE HOLDING AREA.  HISTORY, ALLERGIES, HOME MEDICATIONS AND OPERATIVE PROCEDURE WERE REVIEWED. RISKS AND BENEFITS OF SURGERY DISCUSSED WITH PATIENT AGAIN.  NO CHANGES FROM INITIAL HISTORY AND PHYSICAL NOTED.    

## 2015-09-10 NOTE — Evaluation (Signed)
Physical Therapy Evaluation Patient Details Name: Cristina Roberts MRN: [id number] DOB: Oct 15, 1946 Today's Date: 09/10/2015   History of Present Illness  Pt is a 69 yo female who was admitted to the hospital s/p R THA on 09/10/15.  Clinical Impression  Pt presents with hx of HTN and arthritis. Examination reveals that pt performs bed mobility at mod assist +2, transfers at mod assist, and ambulation of 3 ft at min assist. Pt is very pleasant and motivated to participate in therapy. She still needs reinforcement on hip precautions, but demonstrated learning of some precautions within the session. Due to her pain, ROM, and mobility deficits, pt will continue to benefit from skilled PT in order for her to eventually return home safely. Pt's O2 sats at rest on room air were 98%. Post-ambulation O2 sats were 97%    Follow Up Recommendations SNF    Equipment Recommendations  Rolling walker with 5" wheels    Recommendations for Other Services       Precautions / Restrictions Precautions Precautions: Posterior Hip;Fall Precaution Booklet Issued: No Restrictions Weight Bearing Restrictions: Yes Other Position/Activity Restrictions: PWB      Mobility  Bed Mobility Overal bed mobility: Needs Assistance;+2 for physical assistance Bed Mobility: Supine to Sit     Supine to sit: Mod assist;+2 for physical assistance     General bed mobility comments: Pt able to assist with motion to EOB, but needs assist for LE management and trunk support. Needs cues on how to maintain hip precautions and also how to use her hands to help get to EOB   Transfers Overall transfer level: Needs assistance Equipment used: Rolling walker (2 wheeled) Transfers: Sit to/from Stand Sit to Stand: Mod assist         General transfer comment: Pt requires assist to get into standing, but once in standing she is stable with no LOB or buckling. She needs cues for hand palcement and propping her RLE out    Ambulation/Gait Ambulation/Gait assistance: Min assist Ambulation Distance (Feet): 3 Feet Assistive device: Rolling walker (2 wheeled) Gait Pattern/deviations: Step-to pattern;Decreased step length - right;Decreased step length - left;Antalgic Gait velocity: decreased Gait velocity interpretation: Below normal speed for age/gender General Gait Details: Pt needs assist for advancement of RW and cues for sequencing of RW with gait. She has good stability with ambulation   Stairs            Wheelchair Mobility    Modified Rankin (Stroke Patients Only)       Balance Overall balance assessment: No apparent balance deficits (not formally assessed)                                           Pertinent Vitals/Pain Pain Assessment: 0-10 Pain Score: 4  Pain Location: R hip Pain Intervention(s): Limited activity within patient's tolerance;Monitored during session;Premedicated before session;Ice applied    Home Living Family/patient expects to be discharged to:: Skilled nursing facility Living Arrangements: Alone               Additional Comments: Pt states she has nobody that can help her at d/c    Prior Function Level of Independence: Independent with assistive device(s)         Comments: Pt used cane. She takes care of her mother and her sister. Indep with ADLs     Hand Dominance  Extremity/Trunk Assessment   Upper Extremity Assessment: Overall WFL for tasks assessed           Lower Extremity Assessment: RLE deficits/detail RLE Deficits / Details: RLE MMT grossly 2+/5       Communication   Communication: No difficulties  Cognition Arousal/Alertness: Awake/alert Behavior During Therapy: WFL for tasks assessed/performed Overall Cognitive Status: Within Functional Limits for tasks assessed                      General Comments      Exercises Other Exercises Other Exercises: Pt performed bilateral therex x10  reps at min assist for facilitation of movement. Exerciese performed: ankle pumps, quad sets, glute sets, and hip abd      Assessment/Plan    PT Assessment Patient needs continued PT services  PT Diagnosis Difficulty walking;Abnormality of gait;Acute pain   PT Problem List Decreased strength;Decreased range of motion;Decreased activity tolerance;Decreased mobility;Decreased knowledge of precautions;Pain  PT Treatment Interventions DME instruction;Gait training;Stair training;Functional mobility training;Therapeutic activities;Therapeutic exercise;Balance training;Neuromuscular re-education;Patient/family education;Wheelchair mobility training;Manual techniques;Modalities   PT Goals (Current goals can be found in the Care Plan section) Acute Rehab PT Goals Patient Stated Goal: to go somewhere safe PT Goal Formulation: With patient Time For Goal Achievement: 09/24/15 Potential to Achieve Goals: Good    Frequency BID   Barriers to discharge Decreased caregiver support Pt has no help at home once she d/c's    Co-evaluation               End of Session Equipment Utilized During Treatment: Gait belt Activity Tolerance: Patient tolerated treatment well Patient left: in chair;with call bell/phone within reach;with chair alarm set;with SCD's reapplied Nurse Communication: Mobility status         Time: 9728-2060 PT Time Calculation (min) (ACUTE ONLY): 29 min   Charges:   PT Evaluation $Initial PT Evaluation Tier I: 1 Procedure PT Treatments $Therapeutic Exercise: 8-22 mins   PT G CodesJanyth Contes September 29, 2015, 5:09 PM  Janyth Contes, SPT. [phone]

## 2015-09-10 NOTE — Transfer of Care (Signed)
Immediate Anesthesia Transfer of Care Note  Patient: Jyla Pflieger  Procedure(s) Performed: Procedure(s): TOTAL HIP ARTHROPLASTY (Right)  Patient Location: PACU  Anesthesia Type:General and Regional  Level of Consciousness: awake, alert  and oriented  Airway & Oxygen Therapy: Patient Spontanous Breathing and Patient connected to nasal cannula oxygen  Post-op Assessment: Report given to RN and Post -op Vital signs reviewed and stable  Post vital signs: Reviewed and stable  Last Vitals:  Filed Vitals:   09/10/15 1048  BP: 105/49  Pulse: 71  Temp: 36.6 C  Resp: 18    Complications: No apparent anesthesia complications

## 2015-09-10 NOTE — Progress Notes (Signed)
Plan of care discussed with patient. Patient states that she would like to sleep and not awaking for pain medication. Patient demonstrates correct use of incentive spriometer. Call bell within reach.

## 2015-09-10 NOTE — Anesthesia Preprocedure Evaluation (Addendum)
Anesthesia Evaluation  Patient identified by MRN, date of birth, ID band Patient awake    Reviewed: Allergy & Precautions, NPO status , Patient's Chart, lab work & pertinent test results, reviewed documented beta blocker date and time   Airway Mallampati: II  TM Distance: >3 FB     Dental  (+) Upper Dentures, Lower Dentures   Pulmonary           Cardiovascular hypertension, Pt. on medications      Neuro/Psych    GI/Hepatic   Endo/Other    Renal/GU      Musculoskeletal  (+) Arthritis ,   Abdominal   Peds  Hematology   Anesthesia Other Findings Obese. Echo ok.  Reproductive/Obstetrics                            Anesthesia Physical Anesthesia Plan  ASA: III  Anesthesia Plan: Spinal   Post-op Pain Management:    Induction:   Airway Management Planned: Nasal Cannula  Additional Equipment:   Intra-op Plan:   Post-operative Plan:   Informed Consent: I have reviewed the patients History and Physical, chart, labs and discussed the procedure including the risks, benefits and alternatives for the proposed anesthesia with the patient or authorized representative who has indicated his/her understanding and acceptance.     Plan Discussed with: CRNA  Anesthesia Plan Comments:         Anesthesia Quick Evaluation

## 2015-09-10 NOTE — Evaluation (Signed)
Physical Therapy Evaluation Patient Details Name: Cristina Roberts MRN: [id number] DOB: [birth year] Today's Date: 09/10/2015   History of Present Illness  Pt is a 69 yo female who was admitted to the hospital s/p L THA on 09/10/15.  Clinical Impression  Pt presents with hx of HTN and arthritis. Examination reveals that pt performs bed mobility at mod assist +2, transfers at mod assist, and ambulation of 3 ft at min assist. Pt is very pleasant and motivated to participate in therapy. She still needs reinforcement on hip precautions, but demonstrated learning of some precautions within the session. Due to her pain, ROM, and mobility deficits, pt will continue to benefit from skilled PT in order for her to eventually return home safely. Pt's O2 sats at rest on room air were 98%. Post-ambulation O2 sats were 97%    Follow Up Recommendations SNF    Equipment Recommendations  Rolling walker with 5" wheels    Recommendations for Other Services       Precautions / Restrictions Precautions Precautions: Posterior Hip;Fall Precaution Booklet Issued: No Restrictions Weight Bearing Restrictions: Yes Other Position/Activity Restrictions: PWB      Mobility  Bed Mobility Overal bed mobility: Needs Assistance;+2 for physical assistance Bed Mobility: Supine to Sit     Supine to sit: Mod assist;+2 for physical assistance     General bed mobility comments: Pt able to assist with motion to EOB, but needs assist for LE management and trunk support. Needs cues on how to maintain hip precautions and also how to use her hands to help get to EOB   Transfers Overall transfer level: Needs assistance Equipment used: Rolling walker (2 wheeled) Transfers: Sit to/from Stand Sit to Stand: Mod assist         General transfer comment: Pt requires assist to get into standing, but once in standing she is stable with no LOB or buckling. She needs cues for hand palcement and propping her LLE out    Ambulation/Gait Ambulation/Gait assistance: Min assist Ambulation Distance (Feet): 3 Feet Assistive device: Rolling walker (2 wheeled) Gait Pattern/deviations: Step-to pattern;Decreased step length - right;Decreased step length - left;Antalgic Gait velocity: decreased Gait velocity interpretation: Below normal speed for age/gender General Gait Details: Pt needs assist for advancement of RW and cues for sequencing of RW with gait. She has good stability with ambulation   Stairs            Wheelchair Mobility    Modified Rankin (Stroke Patients Only)       Balance Overall balance assessment: No apparent balance deficits (not formally assessed)                                           Pertinent Vitals/Pain Pain Assessment: 0-10 Pain Score: 4  Pain Location: L hip Pain Intervention(s): Limited activity within patient's tolerance;Monitored during session;Premedicated before session;Ice applied    Home Living Family/patient expects to be discharged to:: Skilled nursing facility Living Arrangements: Alone               Additional Comments: Pt states she has nobody that can help her at d/c    Prior Function Level of Independence: Independent with assistive device(s)         Comments: Pt used cane. She takes care of her mother and her sister. Indep with ADLs     Hand Dominance  Extremity/Trunk Assessment   Upper Extremity Assessment: Overall WFL for tasks assessed           Lower Extremity Assessment: LLE deficits/detail   LLE Deficits / Details: Gross MMT no more than 2+/5 in LLE     Communication   Communication: No difficulties  Cognition Arousal/Alertness: Awake/alert Behavior During Therapy: WFL for tasks assessed/performed Overall Cognitive Status: Within Functional Limits for tasks assessed                      General Comments      Exercises Other Exercises Other Exercises: Pt performed bilateral  therex x10 reps at min assist for facilitation of movement. Exerciese performed: ankle pumps, quad sets, glute sets, and hip abd      Assessment/Plan    PT Assessment Patient needs continued PT services  PT Diagnosis Difficulty walking;Abnormality of gait;Acute pain   PT Problem List Decreased strength;Decreased range of motion;Decreased activity tolerance;Decreased mobility;Decreased knowledge of precautions;Pain  PT Treatment Interventions DME instruction;Gait training;Stair training;Functional mobility training;Therapeutic activities;Therapeutic exercise;Balance training;Neuromuscular re-education;Patient/family education;Wheelchair mobility training;Manual techniques;Modalities   PT Goals (Current goals can be found in the Care Plan section) Acute Rehab PT Goals Patient Stated Goal: to go somewhere safe PT Goal Formulation: With patient Time For Goal Achievement: 09/24/15 Potential to Achieve Goals: Good    Frequency BID   Barriers to discharge Decreased caregiver support Pt has no help at home once she d/c's    Co-evaluation               End of Session Equipment Utilized During Treatment: Gait belt Activity Tolerance: Patient tolerated treatment well Patient left: in chair;with call bell/phone within reach;with chair alarm set;with SCD's reapplied Nurse Communication: Mobility status         Time: 6004-5997 PT Time Calculation (min) (ACUTE ONLY): 29 min   Charges:         PT G CodesJanyth Contes Sep 11, 2015, 5:00 PM  Janyth Contes, SPT. [phone]

## 2015-09-10 NOTE — Op Note (Signed)
09/10/2015  10:58 AM  PATIENT:  Cristina Roberts   MRN: [id number]  PRE-OPERATIVE DIAGNOSIS:  ARTHRITIS OF RIGHT HIP  POST-OPERATIVE DIAGNOSIS:  ARTHRITIS OF RIGHT HIP  PROCEDURE:  Procedure(s): TOTAL HIP ARTHROPLASTY RIGHT HIP WITH DALL-MILES TROCHANTERIC WIRE  PREOPERATIVE INDICATIONS:    Cristina Roberts is an 69 y.o. female who has a diagnosis of ARTHRITIS OF HIP and elected for surgical management after failing conservative treatment.  The risks benefits and alternatives were discussed with the patient including but not limited to the risks of nonoperative treatment, versus surgical intervention including infection, bleeding, nerve injury, periprosthetic fracture, the need for revision surgery, dislocation, leg length discrepancy, blood clots, cardiopulmonary complications, morbidity, mortality, among others, and they were willing to proceed.     OPERATIVE REPORT      SURGEON: Park Breed, MD    ASSISTANT: Thornton Park, MD  ANESTHESIA: Spinal    COMPLICATIONS: Nondisplaced crack in the base of the greater trochanter   DRAINS: 2 Autovacs  EBL:   500 cc                           REPLACED:   None    COMPONENTS:  Depuy AML  femur size 12.0 mm     with a 36     mm + 1.5 mm head ball and a Pinnacle 300 acetabular shell size 52 mm     with a neutral polyethylene liner    PROCEDURE IN DETAIL:   The patient was met in the holding area and  identified.  The appropriate hip was identified and marked at the operative site.  The patient was then transported to the OR  and  placed under spinall anesthesia.  At that point, the patient was  placed in the lateral decubitus position with the operative side up and  secured to the operating room table and all bony prominences padded.     The operative lower extremity was prepped from the iliac crest to the distal leg.  Sterile draping was performed.  Time out was performed prior to incision.      A routine posterolateral approach  was utilized via sharp dissection  carried down to the subcutaneous tissue.  Gross bleeders were Bovie coagulated.  The iliotibial band was identified and incised along the length of the skin incision.  Self-retaining retractors were  inserted.  With the hip internally rotated, the short external rotators  were identified. The piriformis and capsule was tagged with Ticron, and the hip capsule released in a T-type fashion.  The femoral neck was exposed, and I resected the femoral neck using the appropriate jig. This was performed at approximately a thumb's breadth above the lesser trochanter.    I then exposed the deep acetabulum, cleared out any tissue including the ligamentum teres.  A wing retractor was placed.  After adequate visualization, I excised the labrum, and then sequentially reamed.  I placed the trial acetabulum, which seated nicely, and then impacted the real cup into place.  Appropriate version and inclination was confirmed clinically matching their bony anatomy, and also with the use of the jig.  A trial polyethylene liner was placed and the wing retractor removed.    I then prepared the proximal femur using the cookie-cutter, the lateralizing reamer, and then sequentially reamed to 12 mm.  Using a 10.77m trial broach, a small crack was noted at the base of the greater trochanter without displacement.  Further bone was  removed laterally using curettes.  The 12.5 mm millimeter reamer was introduced part way to try and remove lateral bone as well.  The 10.79m and then the12.0 mm rasps were then introduced carefully without further extension of the crack in the greater trochanter.  A trial reduction was then carried out using a 36 mm head with a +1.5 mm neck length The hip was reduced and was very stable.  Leg length lengths were excellent.  The trials were removed and a hole eliminator placed in the acetabular component.  A neutral polyethylene liner was put in place and was stable.  A 2.0 mm  Dall-Miles cable was passed behind the greater trochanter and up under the femoral neck just above the lesser trochanter to be tightened after the final component was in place.  A 12.0 mm AML femoral component was then inserted carefully and seated snugly without significant separation of the crack at the base of the  greater trochanter.  I reduced the hip and it was found to have excellent stability with functional range of motion.  The Dall-Miles cable was then tightened using tightening device and crimped.  Excess wire was  removed.  The trochanteric component was very stable and there was no visible crack in the bone.  Range of motion was excellent. Leg lengths were restored.  Extensive irrigation was carried out through out the procedure. I closed the T in the capsule. and repaired the posterior capsule with #2 Ticron. I then irrigated the hip copiously again with pulse lavage, and repaired the fascia with #2 Quill, followed by 0 Quill for the subcutaneous tissue, and staples for the skin,  and Aquacel. The wounds were injected with Exparel. Sponge and needle counts were correct.  The patient was then awakened and returned to PACU in stable and satisfactory condition.   HPark Breed MD  09/10/2015 10:58 AM

## 2015-09-11 LAB — CBC
HEMATOCRIT: 36.8 % (ref 35.0–47.0)
Hemoglobin: 12.5 g/dL (ref 12.0–16.0)
MCH: 30.9 pg (ref 26.0–34.0)
MCHC: 34 g/dL (ref 32.0–36.0)
MCV: 90.8 fL (ref 80.0–100.0)
Platelets: 176 10*3/uL (ref 150–440)
RBC: 4.06 MIL/uL (ref 3.80–5.20)
RDW: 13.7 % (ref 11.5–14.5)
WBC: 10 10*3/uL (ref 3.6–11.0)

## 2015-09-11 LAB — BASIC METABOLIC PANEL
ANION GAP: 7 (ref 5–15)
BUN: 11 mg/dL (ref 6–20)
CO2: 28 mmol/L (ref 22–32)
Calcium: 8.8 mg/dL — ABNORMAL LOW (ref 8.9–10.3)
Chloride: 99 mmol/L — ABNORMAL LOW (ref 101–111)
Creatinine, Ser: 0.67 mg/dL (ref 0.44–1.00)
GFR calc Af Amer: >60 mL/min (ref >60)
GFR calc non Af Amer: >60 mL/min (ref >60)
GLUCOSE: 116 mg/dL — AB (ref 65–99)
POTASSIUM: 3.3 mmol/L — AB (ref 3.5–5.1)
Sodium: 134 mmol/L — ABNORMAL LOW (ref 135–145)

## 2015-09-11 NOTE — Progress Notes (Signed)
Physical Therapy Treatment Patient Details Name: Cristina Roberts MRN: [id number] DOB: 18-Jan-1946 Today's Date: 09/11/2015    History of Present Illness Pt is a 69 yo female who was admitted to the hospital s/p R THA on 09/10/15.    PT Comments    Pt progressing towards goals this morning, however slightly limited by lightheadedness and hypotension. In standing during ambulation, pt stated feeling slightly dizzy. After a few minutes of rest in sitting pt's BP was 108/56 and then once in standing again her BP was 80/47. Nurse notified of numbers. Pt was still able to perform her therex. Pt will continue to benefit from skilled PT in order to address her strength, ROM, and mobility deficits.   Follow Up Recommendations  SNF     Equipment Recommendations  Rolling walker with 5" wheels    Recommendations for Other Services       Precautions / Restrictions Precautions Precautions: Posterior Hip;Fall Restrictions Weight Bearing Restrictions: Yes Other Position/Activity Restrictions: PWB    Mobility  Bed Mobility Overal bed mobility: Needs Assistance Bed Mobility: Supine to Sit     Supine to sit: Mod assist     General bed mobility comments: Pt needs assist for management of LEs in a way that maintains her hip precautions. She shows good upper body strength by being able to scoot herself to EOB. Only minor trunk assistance  Transfers Overall transfer level: Needs assistance Equipment used: Rolling walker (2 wheeled) Transfers: Sit to/from Stand Sit to Stand: Mod assist         General transfer comment: Pt requires assist to get into standing, but once in standing she is stable with no LOB or buckling. She needs cues for hand palcement and propping her RLE out   Ambulation/Gait Ambulation/Gait assistance: Min assist Ambulation Distance (Feet): 15 Feet Assistive device: Rolling walker (2 wheeled) Gait Pattern/deviations: Decreased step length - right;Decreased step  length - left;Step-to pattern;Decreased dorsiflexion - right Gait velocity: decreased Gait velocity interpretation: Below normal speed for age/gender General Gait Details: Pt needs assist to lift RLE enough to clear the foot in swing through. Pt currently inching foot along with toes. Therapist providing slight assist for facilitation of swing through   Stairs            Wheelchair Mobility    Modified Rankin (Stroke Patients Only)       Balance Overall balance assessment: No apparent balance deficits (not formally assessed)                                  Cognition Arousal/Alertness: Awake/alert Behavior During Therapy: WFL for tasks assessed/performed Overall Cognitive Status: Within Functional Limits for tasks assessed                      Exercises Other Exercises Other Exercises: Pt performed bilateral therex x12 reps at min assist for facilitation of movement. 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 She takes care of her mother and her sister. Indep with ADLs   PT Goals (current goals can now be found in the care plan section) Acute Rehab PT Goals Patient Stated Goal: to do some walking PT Goal Formulation: With patient Time For Goal Achievement: 09/24/15 Potential to Achieve Goals: Good Progress towards PT goals: Progressing toward goals    Frequency  BID    PT Plan Current plan remains  appropriate    Co-evaluation             End of Session Equipment Utilized During Treatment: Gait belt Activity Tolerance: Patient tolerated treatment well Patient left: in chair;with call bell/phone within reach;with chair alarm set;with SCD's reapplied     Time: 0940-1008 PT Time Calculation (min) (ACUTE ONLY): 28 min  Charges:                       G CodesJanyth Contes 10/11/2015, 12:58 PM Janyth Contes, SPT. [phone]

## 2015-09-11 NOTE — Clinical Social Work Note (Signed)
Clinical Social Work Assessment  Patient Details  Name: Cristina Roberts MRN: [id number] Date of Birth: [birth year]  Date of referral:  09/11/15               Reason for consult:  Facility Placement                Permission sought to share information with:  Family Supports Permission granted to share information::  Yes, Verbal Permission Granted  Name::      (son- Tim)  Agency::  SNF  Relationship::  son- Allayne Gitelman Information:  Octavia Bruckner [phone]  Housing/Transportation Living arrangements for the past 2 months:  [place] of Information:  Patient, Adult Children Patient Interpreter Needed:  None Criminal Activity/Legal Involvement Pertinent to Current Situation/Hospitalization:  No - Comment as needed Significant Relationships:    Lives with:    Do you feel safe going back to the place where you live?  No Need for family participation in patient care:  No (Coment)  Care giving concerns:  PT recommending SNF     Social Worker assessment / plan:  CSW met with patient and son at bedside- they are agreeable to SNF at dc. Patient reports this is her first hip surgery- she is living with her 59yo mother and her sister who has dementia. Patient has additional support from her son and daughter-   Employment status:  Retired Nurse, adult PT Recommendations:  [place] / Referral to community resources:  [place]  Patient/Family's Response to care:  They are pleased with her care and hopeful for her progress and return home soon.  Patient/Family's Understanding of and Emotional Response to Diagnosis, Current Treatment, and Prognosis:  Both patient and son understand plans and are optimistic her pain will be lessened and she can do more activities going forward.   Emotional Assessment Appearance:  Appears younger than stated age Attitude/Demeanor/Rapport:   (good) Affect (typically observed):   Accepting, Appropriate, Hopeful Orientation:  Oriented to Place, Oriented to Self, Oriented to  Time, Oriented to Situation Alcohol / Substance use:  Not Applicable Psych involvement (Current and /or in the community):  No (Comment)  Discharge Needs  Concerns to be addressed:  Discharge Planning Concerns Readmission within the last 30 days:  No Current discharge risk:  Physical Impairment Barriers to Discharge:  No Barriers Identified   Ludwig Clarks, LCSW 09/11/2015, 1:18 PM

## 2015-09-11 NOTE — Progress Notes (Signed)
Subjective: 1 Day Post-Op Procedure(s) (LRB): TOTAL HIP ARTHROPLASTY (Right)    Patient reports pain as moderate. hgb good. Drain removed.  Alert and oriented.Post op x-rays satisfactory  Objective:   VITALS:   Filed Vitals:   09/11/15 1059  BP: 118/65  Pulse: 95  Temp: 98.7 F (37.1 C)  Resp: 17    Neurologically intact ABD soft Sensation intact distally Intact pulses distally Dorsiflexion/Plantar flexion intact Compartment soft  LABS  Recent Labs  09/10/15 1302 09/11/15 0428  HGB 13.5 12.5  HCT 40.6 36.8  WBC 14.8* 10.0  PLT 200 176     Recent Labs  09/10/15 1302 09/11/15 0428  NA  --  134*  K  --  3.3*  BUN  --  11  CREATININE 0.74 0.67  GLUCOSE  --  116*    No results for input(s): LABPT, INR in the last 72 hours.   Assessment/Plan: 1 Day Post-Op Procedure(s) (LRB): TOTAL HIP ARTHROPLASTY (Right)   Advance diet Up with therapy D/C IV fluids Discharge to SNF when stable

## 2015-09-11 NOTE — Anesthesia Postprocedure Evaluation (Cosign Needed)
  Anesthesia Post-op Note  Patient: Programmer, multimedia  Procedure(s) Performed: Procedure(s): TOTAL HIP ARTHROPLASTY (Right)  Anesthesia type:Spinal  Patient location: 153  Post pain: Pain level controlled  Post assessment: Post-op Vital signs reviewed, Patient's Cardiovascular Status Stable, Respiratory Function Stable, Patent Airway and No signs of Nausea or vomiting  Post vital signs: Reviewed and stable  Last Vitals:  Filed Vitals:   09/11/15 0725  BP: 100/50  Pulse: 90  Temp: 37.5 C  Resp: 18    Level of consciousness: awake, alert  and patient cooperative  Complications: No apparent anesthesia complications

## 2015-09-11 NOTE — Progress Notes (Signed)
Physical Therapy Treatment Patient Details Name: Cristina Roberts MRN: [id number] DOB: 1946/11/01 Today's Date: 09/11/2015    History of Present Illness Pt is a 69 yo female who was admitted to the hospital s/p R THA on 09/10/15.    PT Comments    Pt much improved this afternoon with no lightheadedness and an ability to ambulate greater distances with better leg advancement. She has also improved her independence with transfers. She still has ROM, strength, and gait deficits that will continue to benefit from skilled PT in order for her to eventually return home safely. Pt is very pleasant and motivated to improve.  Follow Up Recommendations  SNF     Equipment Recommendations  Rolling walker with 5" wheels    Recommendations for Other Services       Precautions / Restrictions Precautions Precautions: Posterior Hip;Fall Restrictions Weight Bearing Restrictions: Yes RLE Weight Bearing: Partial weight bearing RLE Partial Weight Bearing Percentage or Pounds: 50 Other Position/Activity Restrictions: PWB    Mobility  Bed Mobility Overal bed mobility:  (Pt received in recliner; not assessed ) Bed Mobility: Supine to Sit     Supine to sit: Mod assist     General bed mobility comments: Pt needs assist for management of LEs in a way that maintains her hip precautions. She shows good upper body strength by being able to scoot herself to EOB. Only minor trunk assistance  Transfers Overall transfer level: Needs assistance Equipment used: Rolling walker (2 wheeled) Transfers: Sit to/from Stand Sit to Stand: Min assist         General transfer comment:  (Pt now at min assist. Much improved since the morning)  Ambulation/Gait Ambulation/Gait assistance: Min assist Ambulation Distance (Feet): 80 Feet Assistive device: Rolling walker (2 wheeled) Gait Pattern/deviations: Step-to pattern;Decreased step length - right;Decreased step length - left;Antalgic Gait velocity:  decreased Gait velocity interpretation: Below normal speed for age/gender General Gait Details: Pt's gait performance much improved as well. She is able to advance RLE on her own especially when encouraged to really push through walker to weight shift and clear the leg in swing through. Pt better with sequencing of RW with gait as well    Stairs            Wheelchair Mobility    Modified Rankin (Stroke Patients Only)       Balance Overall balance assessment: No apparent balance deficits (not formally assessed)                                  Cognition Arousal/Alertness: Awake/alert Behavior During Therapy: WFL for tasks assessed/performed Overall Cognitive Status: Within Functional Limits for tasks assessed       Memory:  (Recalls all 3 precautions )              Exercises Other Exercises Other Exercises: Pt performed bilateral therex x12 reps at min assist for facilitation of movement. Exerciese performed: ankle pumps, quad sets, glute sets, and hip abd    General Comments        Pertinent Vitals/Pain Pain Assessment: 0-10 Pain Score: 3  Pain Location: R hip Pain Intervention(s): Limited activity within patient's tolerance;Monitored during session;Premedicated before session;Ice applied    Home Living                      Prior Function            PT Goals (  current goals can now be found in the care plan section) Acute Rehab PT Goals Patient Stated Goal: to go further than this morning PT Goal Formulation: With patient Time For Goal Achievement: 09/24/15 Potential to Achieve Goals: Good Progress towards PT goals: Progressing toward goals    Frequency  BID    PT Plan Current plan remains appropriate    Co-evaluation             End of Session Equipment Utilized During Treatment: Gait belt Activity Tolerance: Patient tolerated treatment well Patient left:  (Son in room )     Time: 3704-8889 PT Time  Calculation (min) (ACUTE ONLY): 25 min  Charges:  $Gait Training: 8-22 mins $Therapeutic Exercise: 8-22 mins                    G CodesJanyth Contes 2015/09/18, 3:58 PM  Janyth Contes, SPT. [phone]

## 2015-09-11 NOTE — Clinical Social Work Placement (Signed)
   CLINICAL SOCIAL WORK PLACEMENT  NOTE  Date:  09/11/2015  Patient Details  Name: Cristina Roberts MRN: [id number] Date of Birth: 1946-04-23  Clinical Social Work is seeking post-discharge placement for this patient at the [place] level of care (*CSW will initial, date and re-position this form in  chart as items are completed):  Yes   Patient/family provided with [facility] Work Department's list of facilities offering this level of care within the geographic area requested by the patient (or if unable, by the patient's family).  Yes   Patient/family informed of their freedom to choose among providers that offer the needed level of care, that participate in Medicare, Medicaid or managed care program needed by the patient, have an available bed and are willing to accept the patient.  Yes   Patient/family informed of [facility]'s ownership interest in [facility] and [facility], as well as of the fact that they are under no obligation to receive care at these facilities.  PASRR submitted to EDS on 09/11/15     PASRR number received on 09/11/15     Existing PASRR number confirmed on       FL2 transmitted to all facilities in geographic area requested by pt/family on 09/11/15     FL2 transmitted to all facilities within larger geographic area on       Patient informed that his/her managed care company has contracts with or will negotiate with certain facilities, including the following:            Patient/family informed of bed offers received.  Patient chooses bed at       Physician recommends and patient chooses bed at      Patient to be transferred to   on  .  Patient to be transferred to facility by       Patient family notified on   of transfer.  Name of family member notified:        PHYSICIAN Please prepare priority discharge summary, including medications     Additional Comment:     _______________________________________________ Ludwig Clarks, LCSW 09/11/2015, 1:28 PM

## 2015-09-11 NOTE — Care Management (Signed)
RNCM consult received and will continue to follow. PT was working with patient when this RNCM rounded- PT is recommending SNF. Patient wants [facility]. CSW referral placed with this request.

## 2015-09-11 NOTE — Evaluation (Signed)
Occupational Therapy Evaluation Patient Details Name: Cristina Roberts MRN: [id number] DOB: [birth year] Today's Date: 09/11/2015    History of Present Illness Pt is a 69 yo female who was admitted to the hospital s/p R THA on 09/10/15.   Clinical Impression   This patient is a 69 year old female who came to [facility] for a R total hip replacement (posterior approach) .  Patient lives in a home with with her mother and sister and is caretaker for both.  She had been independent with ADL and functional mobility using a single point cane. She now requires  assistance and would benefit from Occupational Therapy for ADL/functioal mobility training.      Follow Up Recommendations  SNF    Equipment Recommendations       Recommendations for Other Services       Precautions / Restrictions Precautions Precautions: Posterior Hip;Fall Restrictions Other Position/Activity Restrictions: PWB      Mobility Bed Mobility                  Transfers                      Balance                                            ADL                                         General ADL Comments: Had been independent with cane, now practiced techniques for lower body dressing Donned/doffed socks and pants to knees (drain still in place). She required moderate assist and verbal cues for technique and safety and to stay within hip precautions (posterior approach).      Vision     Perception     Praxis      Pertinent Vitals/Pain Pain Score: 3  Pain Location: R hip     Hand Dominance     Extremity/Trunk Assessment             Communication Communication Communication: No difficulties   Cognition Arousal/Alertness: Awake/alert Behavior During Therapy: WFL for tasks assessed/performed Overall Cognitive Status: Within Functional Limits for tasks assessed                     General Comments       Exercises       Shoulder Instructions      Home Living Family/patient expects to be discharged to:: Skilled nursing facility                                 Additional Comments: 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 She takes care of her mother and her sister. Indep with ADLs    OT Diagnosis: Acute pain   OT Problem List:     OT Treatment/Interventions: Self-care/ADL training    OT Goals(Current goals can be found in the care plan section) Acute Rehab OT Goals Patient Stated Goal: get some rehab OT Goal Formulation: With patient Time For  Goal Achievement: 09/25/15 Potential to Achieve Goals: Good  OT Frequency: Min 1X/week   Barriers to D/C:            Co-evaluation              End of Session Equipment Utilized During Treatment:  (Hip kit)  Activity Tolerance:   Patient left: in chair;with chair alarm set;with call bell/phone within reach   Time: 1017-1040 OT Time Calculation (min): 23 min Charges:  OT General Charges $OT Visit: 1 Procedure OT Evaluation $Initial OT Evaluation Tier I: 1 Procedure OT Treatments $Self Care/Home Management : 8-22 mins G-Codes:    Myrene Galas, MS/OTR/L  09/11/2015, 10:47 AM

## 2015-09-11 NOTE — Anesthesia Post-op Follow-up Note (Cosign Needed)
  Anesthesia Pain Follow-up Note  Patient: Cristina Roberts  Day #: 1  Date of Follow-up: 09/11/2015 Time: 7:26 AM  Last Vitals:  Filed Vitals:   09/11/15 0725  BP: 100/50  Pulse: 90  Temp: 37.5 C  Resp: 18    Level of Consciousness: alert  Pain: none   Side Effects:None  Catheter Site Exam:clean  Plan: D/C from anesthesia care  Delaney Meigs

## 2015-09-11 NOTE — Progress Notes (Signed)
Spoke with Dr Sabra Heck regarding holding  BP meds states he was agreeable to meds being held.

## 2015-09-12 LAB — CBC
HEMATOCRIT: 33.8 % — AB (ref 35.0–47.0)
Hemoglobin: 11.6 g/dL — ABNORMAL LOW (ref 12.0–16.0)
MCH: 31.3 pg (ref 26.0–34.0)
MCHC: 34.3 g/dL (ref 32.0–36.0)
MCV: 91 fL (ref 80.0–100.0)
PLATELETS: 149 10*3/uL — AB (ref 150–440)
RBC: 3.71 MIL/uL — AB (ref 3.80–5.20)
RDW: 14 % (ref 11.5–14.5)
WBC: 10.2 10*3/uL (ref 3.6–11.0)

## 2015-09-12 LAB — GLUCOSE, CAPILLARY: Glucose-Capillary: 133 mg/dL — ABNORMAL HIGH (ref 65–99)

## 2015-09-12 LAB — SURGICAL PATHOLOGY

## 2015-09-12 MED ORDER — MENTHOL 3 MG MT LOZG: 1.0000 | LOZENGE | OROMUCOSAL | Status: DC | PRN

## 2015-09-12 MED ORDER — CELECOXIB 200 MG PO CAPS: 200.0000 mg | ORAL_CAPSULE | Freq: Two times a day (BID) | ORAL | Status: DC

## 2015-09-12 MED ORDER — PREGABALIN 75 MG PO CAPS: 75.0000 mg | ORAL_CAPSULE | Freq: Two times a day (BID) | ORAL | Status: DC

## 2015-09-12 MED ORDER — FERROUS SULFATE 325 (65 FE) MG PO TABS: 325.0000 mg | ORAL_TABLET | Freq: Every day | ORAL | Status: DC

## 2015-09-12 MED ORDER — BENAZEPRIL HCL 20 MG PO TABS: 20.0000 mg | ORAL_TABLET | Freq: Every day | ORAL | Status: DC

## 2015-09-12 MED ORDER — ALUM & MAG HYDROXIDE-SIMETH 200-200-20 MG/5ML PO SUSP: 30.0000 mL | ORAL | Status: DC | PRN

## 2015-09-12 MED ORDER — ONDANSETRON HCL 4 MG PO TABS: 4.0000 mg | ORAL_TABLET | Freq: Four times a day (QID) | ORAL | Status: DC | PRN

## 2015-09-12 MED ORDER — HYDROCODONE-ACETAMINOPHEN 10-325 MG PO TABS: 1.0000 | ORAL_TABLET | ORAL | Status: DC | PRN

## 2015-09-12 MED ORDER — AMLODIPINE BESYLATE 5 MG PO TABS: 5.0000 mg | ORAL_TABLET | Freq: Every day | ORAL | Status: DC

## 2015-09-12 MED ORDER — METHOCARBAMOL 500 MG PO TABS: 500.0000 mg | ORAL_TABLET | Freq: Four times a day (QID) | ORAL | Status: DC | PRN

## 2015-09-12 MED ORDER — ASPIRIN EC 325 MG PO TBEC: 325.0000 mg | DELAYED_RELEASE_TABLET | Freq: Two times a day (BID) | ORAL | Status: DC

## 2015-09-12 MED ORDER — BISACODYL 10 MG RE SUPP: 10.0000 mg | Freq: Every day | RECTAL | Status: DC | PRN

## 2015-09-12 MED ORDER — ACETAMINOPHEN 500 MG PO TABS: 1000.0000 mg | ORAL_TABLET | Freq: Four times a day (QID) | ORAL | Status: DC | PRN

## 2015-09-12 MED ORDER — CALCIUM CARBONATE-VITAMIN D 500-200 MG-UNIT PO TABS: 1.0000 | ORAL_TABLET | Freq: Every day | ORAL | Status: DC

## 2015-09-12 MED ORDER — HYDROCHLOROTHIAZIDE 25 MG PO TABS: 25.0000 mg | ORAL_TABLET | Freq: Every day | ORAL | Status: DC

## 2015-09-12 MED ORDER — ACETAMINOPHEN 650 MG RE SUPP: 650.0000 mg | Freq: Four times a day (QID) | RECTAL | Status: DC | PRN

## 2015-09-12 MED ORDER — MAGNESIUM HYDROXIDE 400 MG/5ML PO SUSP: 30.0000 mL | Freq: Every day | ORAL | Status: DC | PRN

## 2015-09-12 MED ORDER — MORPHINE SULFATE (PF) 2 MG/ML IV SOLN: 2.0000 mg | INTRAVENOUS | Status: DC | PRN

## 2015-09-12 MED ORDER — DIPHENHYDRAMINE HCL 12.5 MG/5ML PO ELIX: 12.5000 mg | ORAL_SOLUTION | ORAL | Status: DC | PRN

## 2015-09-12 MED ORDER — SENNA 8.6 MG PO TABS: 1.0000 | ORAL_TABLET | Freq: Two times a day (BID) | ORAL | Status: DC

## 2015-09-12 MED ORDER — ZOLPIDEM TARTRATE 5 MG PO TABS: 5.0000 mg | ORAL_TABLET | Freq: Every evening | ORAL | Status: DC | PRN

## 2015-09-12 MED ORDER — METOCLOPRAMIDE HCL 5 MG PO TABS: 5.0000 mg | ORAL_TABLET | Freq: Three times a day (TID) | ORAL | Status: DC | PRN

## 2015-09-12 NOTE — Discharge Summary (Signed)
Physician Discharge Summary  Patient ID: Cristina Roberts MRN: [id number] DOB/AGE: 1946/06/15 69 y.o.  Admit date: 09/10/2015 Discharge date:  09/13/2015 Admission Diagnoses: Advanced osteoarthritis, right hip  Discharge Diagnoses: Advanced osteoarthritis, right hip Active Problems:   S/P total hip arthroplasty   Discharged Condition: Improved  Hospital Course: Patient underwent right total hip replacement 09/10/2015.  She did well postoperatively with minimal pain.  She began physical therapy.  Her hemoglobin remained stable.  She is planned to go to skilled nursing for rehabilitation tomorrow, 09/13/2015.  She had minimal drainage and her dressing was changed 09/12/2015.  The wound was benign.  Consults: None  Significant Diagnostic Studies: radiology: X-Ray: Satisfactory total hip replacement.  Cable around the greater trochanter due to small fracture line.  Treatments: antibiotics: Kefzol and Cipro.  Physical therapy.  Discharge Exam: Blood pressure 153/66, pulse 114, temperature 99.5 F (37.5 C), temperature source Oral, resp. rate 18, height 5\' 5"  (1.651 m), weight 98.839 kg (217 lb 14.4 oz), SpO2 97 %. The leg lengths are equal.  She has good passive motion of the hip.  Neurovascular status good distally.  The thigh is soft.  Disposition:  Discharged to skilled nursing tomorrow.     Medication List    STOP taking these medications        diclofenac 75 MG EC tablet  Commonly known as:  VOLTAREN      TAKE these medications        amLODipine 5 MG tablet  Commonly known as:  NORVASC  Take 5 mg by mouth daily.     benazepril-hydrochlorthiazide 20-25 MG per tablet  Commonly known as:  LOTENSIN HCT  Take 1 tablet by mouth daily.     CALCIUM 600+D 600-400 MG-UNIT per tablet  Generic drug:  Calcium Carbonate-Vitamin D  Take 1 tablet by mouth daily.           Follow-up Information    Follow up with Roberts,Cristina E, MD. Schedule an appointment as soon as  possible for a visit in 1 week.   Specialty:  Specialist   Why:  For wound re-check   Contact information:   [place] [address] [phone]       Signed: Park Breed 09/12/2015, 12:13 PM

## 2015-09-12 NOTE — Care Management Important Message (Signed)
Important Message  Patient Details  Name: Cristina Roberts MRN: [id number] Date of Birth: 11/19/1946   Medicare Important Message Given:  Yes-second notification given    Darius Bump Allmond 09/12/2015, 10:16 AM

## 2015-09-12 NOTE — Discharge Instructions (Signed)
Hip Rehabilitation, Guidelines Following Surgery The results of a hip operation are greatly improved after range of motion and muscle strengthening exercises. Follow all safety measures which are given to protect your hip. If any of these exercises cause increased pain or swelling in your joint, decrease the amount until you are comfortable again. Then slowly increase the exercises. Call your caregiver if you have problems or questions. HOME CARE INSTRUCTIONS  Most of the following instructions are designed to prevent the dislocation of your new hip.  Do not put on socks or shoes without following the instructions of your caregivers.  Sit on high chairs so your hips are not bent more than 90 degrees.  Sit on chairs with arms. Use the chair arms to help push yourself up when arising.  Keep your leg on the side of the operation out in front of you when standing up.  Arrange for the use of a toilet seat elevator so you are not sitting low.  Do not do any exercises or get in any positions that cause your toes to point in (pigeon toed).  Always sleep with a pillow between your legs. Do not lie on your side in sleep with both knees touching the bed.  You may resume a sexual relationship in one month or when given the OK by your caregiver.  Use crutches or walker as long as suggested by your caregivers.  Begin weight bearing with your caregiver's approval.  Avoid periods of inactivity such as sitting longer than an hour when not asleep. This helps prevent blood clots.  Return to work as instructed by your caregiver.  Do not drive a car for 6 weeks or as instructed.  Do not drive while taking narcotics.  Wear elastic stockings until instructed not to.  Make sure you keep all of your appointments after your operation with all of your doctors and caregivers. RANGE OF MOTION AND STRENGTHENING EXERCISES These exercises are designed to help you keep full movement of your hip joint. Follow  your caregiver's or physical therapist's instructions. Perform all exercises about fifteen times, three times per day or as directed. Exercise both hips, even if you have had only one joint replacement. These exercises can be done on a training (exercise) mat, on the floor, on a table or on a bed. Use whatever works the best and is most comfortable for you. Use music or television while you are exercising so that the exercises are a pleasant break in your day. This will make your life better with the exercises acting as a break in routine you can look forward to.  Lying on your back, slowly slide your foot toward your buttocks, raising your knee up off the floor. Then slowly slide your foot back down until your leg is straight again.  Lying on your back spread your legs as far apart as you can without causing discomfort.  Lying on your side, raise your upper leg and foot straight up from the floor as far as is comfortable. Slowly lower the leg and repeat.  Lying on your back, tighten up the muscle in the front of your thigh (quadriceps muscles). You can do this by keeping your leg straight and trying to raise your heel off the floor. This helps strengthen the largest muscle supporting your knee.  Lying on your back, tighten up the muscles of your buttocks both with the legs straight and with the knee bent at a comfortable angle while keeping your heel on the floor.  Lying on your stomach, lift your toes off the floor towards your buttocks. Bend your knee as far as is comfortable. Tighten the muscles in the buttocks while doing this. Document Released: 07/09/2004 Document Revised: 02/28/2012 Document Reviewed: 03/07/2014 [facility] Patient Information 2015 [place], [place]. This information is not intended to replace advice given to you by your health care provider. Make sure you discuss any questions you have with your health care provider.

## 2015-09-12 NOTE — Plan of Care (Signed)
Problem: Consults Goal: Diagnosis- Total Joint Replacement Outcome: Completed/Met Date Met:  09/12/15 Hemiarthroplasty

## 2015-09-12 NOTE — Progress Notes (Signed)
Occupational Therapy Treatment Patient Details Name: Cristina Roberts MRN: [id number] DOB: 25-Jun-1946 Today's Date: 09/12/2015    History of present illness Pt is a 69 yo female who was admitted to the hospital s/p R THA on 09/10/15.   OT comments  Patient improving in dressing skills using hip kit for lower body dressing.  Follow Up Recommendations  SNF    Equipment Recommendations       Recommendations for Other Services      Precautions / Restrictions Precautions Precautions: Posterior Hip;Fall Restrictions Weight Bearing Restrictions: Yes Other Position/Activity Restrictions: PWB       Mobility Bed Mobility                  Transfers                      Balance                                   ADL                                         General ADL Comments: . Patient did not have pants here so practiced lower body dressing and able to Donned/doffed socks and pants to knees (used a pillow case to simulate pants) with only one verbal cue to stay within hip precautions and one for techniques.      Vision                     Perception     Praxis      Cognition   Behavior During Therapy: WFL for tasks assessed/performed Overall Cognitive Status: Within Functional Limits for tasks assessed                       Extremity/Trunk Assessment               Exercises     Shoulder Instructions       General Comments      Pertinent Vitals/ Pain       Pain Score: 4   Home Living                                          Prior Functioning/Environment              Frequency       Progress Toward Goals  OT Goals(current goals can now be found in the care plan section)        Plan      Co-evaluation                 End of Session Equipment Utilized During Treatment:  (hip kit)   Activity Tolerance     Patient Left in chair;with call  bell/phone within reach;with chair alarm set   Nurse Communication          Time: 5784-6962 OT Time Calculation (min): 18 min  Charges: OT General Charges $OT Visit: 1 Procedure OT Treatments $Self Care/Home Management : 8-22 mins Sharon Mt, MS/OTR/L  Sharon Mt 09/12/2015, 10:13 AM

## 2015-09-12 NOTE — Progress Notes (Signed)
Physical Therapy Treatment Patient Details Name: Cristina Roberts MRN: [id number] DOB: 1946-11-26 Today's Date: 09/12/2015    History of Present Illness Pt is a 69 yo female who was admitted to the hospital s/p R THA on 09/10/15.    PT Comments    Pt progressing towards goals with increased therex tolerance and improvements with gait and transfers. She still has gait deficits that continue to need reinforcement. Pt demonstrates knowledge of hip precautions but still needs cues in their applications during functional mobility. Due to these deficits she will continue to benefit from skilled PT in order for her to eventually return home safely.   Follow Up Recommendations  SNF     Equipment Recommendations  Rolling walker with 5" wheels    Recommendations for Other Services       Precautions / Restrictions Precautions Precautions: Posterior Hip;Fall Precaution Booklet Issued: Yes (comment) (exercise and precaution booklet issued) Restrictions Weight Bearing Restrictions: Yes RLE Weight Bearing: Partial weight bearing RLE Partial Weight Bearing Percentage or Pounds: 50 Other Position/Activity Restrictions: PWB    Mobility  Bed Mobility Overal bed mobility:  (In recliner upon entry; not assessed )                Transfers Overall transfer level: Needs assistance Equipment used: Rolling walker (2 wheeled) Transfers: Sit to/from Stand Sit to Stand: Min assist         General transfer comment: Pt requires assist to get into standing, but once in standing she is stable with no LOB or buckling. She needs cues for hand palcement and propping her RLE out   Ambulation/Gait Ambulation/Gait assistance: Min assist Ambulation Distance (Feet): 90 Feet Assistive device: Rolling walker (2 wheeled) Gait Pattern/deviations: Step-to pattern;Decreased step length - left;Decreased step length - right Gait velocity: decreased Gait velocity interpretation: Below normal speed for  age/gender General Gait Details: Pt still requiring assist for pushing walker to appropriate distance in front of her. She requires cues to shortern her right step length in order to advance left foot into reciprocal gait pattern. Needs reinforcement. Pt able to maintain PWB status    Stairs            Wheelchair Mobility    Modified Rankin (Stroke Patients Only)       Balance Overall balance assessment: No apparent balance deficits (not formally assessed)                                  Cognition Arousal/Alertness: Awake/alert Behavior During Therapy: WFL for tasks assessed/performed Overall Cognitive Status: Within Functional Limits for tasks assessed       Memory:  (Recalls all 3 hip precautions)              Exercises Other Exercises Other Exercises: Pt performed bilateral therex x15 reps at min assist for facilitation of movement. Exerciese performed: ankle pumps, quad sets, glute sets, and hip abd    General Comments        Pertinent Vitals/Pain Pain Assessment: 0-10 Pain Score: 2  Pain Location: R hip Pain Intervention(s): Limited activity within patient's tolerance;Monitored during session;Premedicated before session;Ice applied    Home Living                      Prior Function            PT Goals (current goals can now be found in the care plan section) Acute  Rehab PT Goals Patient Stated Goal: to walk PT Goal Formulation: With patient Time For Goal Achievement: 09/24/15 Potential to Achieve Goals: Good Progress towards PT goals: Progressing toward goals    Frequency  BID    PT Plan Current plan remains appropriate    Co-evaluation             End of Session Equipment Utilized During Treatment: Gait belt Activity Tolerance: Patient tolerated treatment well Patient left: in chair;with call bell/phone within reach;with chair alarm set;with SCD's reapplied     Time: 1962-2297 PT Time Calculation (min)  (ACUTE ONLY): 24 min  Charges:                       G CodesJanyth Contes 2015/10/07, 12:45 PM  Janyth Contes, SPT. [phone]

## 2015-09-12 NOTE — Clinical Social Work Note (Signed)
Patient has received bed offer from facility of choice: [facility]. CSW has attempted to reach patient's son: Octavia Bruckner: [phone] but his mailbox was not set up and there was no answer. Discharge summary sent to [facility] at [facility]. Shela Leff MSW,LCSW [phone]

## 2015-09-12 NOTE — Progress Notes (Signed)
Subjective: 2 Days Post-Op Procedure(s) (LRB): TOTAL HIP ARTHROPLASTY (Right)    Patient reports pain as mild. OOB in chair.  Feels well.  Walked a little further. Plan SNF tomorrow.  Objective:   VITALS:   Filed Vitals:   09/12/15 0757  BP: 153/66  Pulse: 114  Temp: 99.5 F (37.5 C)  Resp: 18    ABD soft Neurovascular intact Sensation intact distally Intact pulses distally Dorsiflexion/Plantar flexion intact Incision: scant drainage  LABS  Recent Labs  09/10/15 1302 09/11/15 0428 09/12/15 0421  HGB 13.5 12.5 11.6*  HCT 40.6 36.8 33.8*  WBC 14.8* 10.0 10.2  PLT 200 176 149*     Recent Labs  09/10/15 1302 09/11/15 0428  NA  --  134*  K  --  3.3*  BUN  --  11  CREATININE 0.74 0.67  GLUCOSE  --  116*    No results for input(s): LABPT, INR in the last 72 hours.   Assessment/Plan: 2 Days Post-Op Procedure(s) (LRB): TOTAL HIP ARTHROPLASTY (Right)   Advance diet Up with therapy D/C IV fluids Discharge to SNF tomorrow

## 2015-09-12 NOTE — Progress Notes (Signed)
Physical Therapy Treatment Patient Details Name: Cristina Roberts MRN: [id number] DOB: [birth year] Today's Date: 09/12/2015    History of Present Illness Pt is a 69 yo female who was admitted to the hospital s/p R THA on 09/10/15.    PT Comments    Pt again progressing this afternoon, remaining pleasant and motivated to participate in therapy tasks. Pt continues to improve on maintaining hip precautions with mobility. She is also improving on her gait performance, although this continues to require reinforcement. She will continue to benefit from skilled PT in order to address her strength, ROM, and gait deficits in order to eventually return home safely.   Follow Up Recommendations  SNF     Equipment Recommendations  Rolling walker with 5" wheels    Recommendations for Other Services       Precautions / Restrictions Precautions Precautions: Posterior Hip;Fall Precaution Booklet Issued: Yes (comment) Restrictions Weight Bearing Restrictions: Yes RLE Weight Bearing: Partial weight bearing RLE Partial Weight Bearing Percentage or Pounds: 50    Mobility  Bed Mobility Overal bed mobility:  (In recliner upon entry; not assessed )             General bed mobility comments: Pt in recliner this session; not assessed  Transfers Overall transfer level: Needs assistance Equipment used: Rolling walker (2 wheeled) Transfers: Sit to/from Stand Sit to Stand: Min assist         General transfer comment: Pt requires assist to get into standing, but once in standing she is stable with no LOB or buckling. She needs cues for hand palcement and propping her RLE out  (Better job keeping hip opened this afternoon)  Ambulation/Gait Ambulation/Gait assistance: Museum/gallery curator (Feet): 100 Feet Assistive device: Rolling walker (2 wheeled) Gait Pattern/deviations: Step-to pattern;Decreased step length - right;Decreased step length - left Gait velocity: decreased Gait  velocity interpretation: <1.8 ft/sec, indicative of risk for recurrent falls General Gait Details: Pt still requiring assist for pushing walker to appropriate distance in front of her. She requires cues to shortern her right step length in order to advance left foot into reciprocal gait pattern. Needs reinforcement. Pt able to maintain PWB status    Stairs            Wheelchair Mobility    Modified Rankin (Stroke Patients Only)       Balance Overall balance assessment: No apparent balance deficits (not formally assessed)                                  Cognition Arousal/Alertness: Awake/alert Behavior During Therapy: WFL for tasks assessed/performed Overall Cognitive Status: Within Functional Limits for tasks assessed       Memory:  (Recalls all 3 hip precautions)              Exercises Other Exercises Other Exercises: Pt performed bilateral therex x15 reps at min assist for facilitation of movement. Exerciese performed:, SAQ, LAQ, ankle pumps, quad sets, glute sets, and hip abd    General Comments        Pertinent Vitals/Pain Pain Assessment: 0-10 Pain Score: 4  Pain Location: R hip Pain Intervention(s): Limited activity within patient's tolerance;Monitored during session;Premedicated before session    Home Living                      Prior Function            PT  Goals (current goals can now be found in the care plan section) Acute Rehab PT Goals Patient Stated Goal: to walk again PT Goal Formulation: With patient Time For Goal Achievement: 09/24/15 Potential to Achieve Goals: Good Progress towards PT goals: Progressing toward goals    Frequency  BID    PT Plan Current plan remains appropriate    Co-evaluation             End of Session Equipment Utilized During Treatment: Gait belt Activity Tolerance: Patient tolerated treatment well Patient left: in chair;with call bell/phone within reach;with chair alarm  set;with SCD's reapplied     Time: 0825-0850 PT Time Calculation (min) (ACUTE ONLY): 25 min  Charges:  $Gait Training: 8-22 mins $Therapeutic Exercise: 8-22 mins                    G CodesJanyth Contes 26-Sep-2015, 3:54 PM Janyth Contes, SPT. [phone]

## 2015-09-13 LAB — CBC
HCT: 31.8 % — ABNORMAL LOW (ref 35.0–47.0)
HEMOGLOBIN: 10.9 g/dL — AB (ref 12.0–16.0)
MCH: 30.7 pg (ref 26.0–34.0)
MCHC: 34.2 g/dL (ref 32.0–36.0)
MCV: 89.8 fL (ref 80.0–100.0)
Platelets: 161 10*3/uL (ref 150–440)
RBC: 3.54 MIL/uL — AB (ref 3.80–5.20)
RDW: 13.8 % (ref 11.5–14.5)
WBC: 9.6 10*3/uL (ref 3.6–11.0)

## 2015-09-13 LAB — CREATININE, SERUM
CREATININE: 0.7 mg/dL (ref 0.44–1.00)
GFR calc Af Amer: >60 mL/min (ref >60)
GFR calc non Af Amer: >60 mL/min (ref >60)

## 2015-09-13 NOTE — Progress Notes (Signed)
POD 3. Pt. Alert and oriented. VSS . Pain controlled with PO pain meds. Up ambulating to bathroom with walker. Pt. Had BM yesterday. Dressing clean dry and intact. 5lbs. Bucks traction to RLE. Neurochecks WDL. Pt. Rested quietly throughout the night. Will continue to monitor.

## 2015-09-13 NOTE — Clinical Social Work Note (Signed)
CSW spoke to pt and pt's daughter Malachy Mood [phone] and notified both parties that pt would DC today via EMS.  Facility was notified yesterady of today's DC.  CSW signing off unless further needs arise.

## 2015-09-13 NOTE — Progress Notes (Signed)
Physical Therapy Treatment Patient Details Name: Cristina Roberts MRN: [id number] DOB: 1946-09-19 Today's Date: 09/13/2015    History of Present Illness Pt is a 69 y.o. female who was admitted to the hospital s/p R THA on 09/10/15.    PT Comments    Patient is a pleasant 69 y.o. Female who was very motivated to participate in PT, despite rating pain 7/10. Notes that pain decreased with activity. Was able to verbalize posterior hip precautions as well as weight bearing restrictions. Patient demonstrated slight deviations in gait and required verbal cues to relax shoulders and position RW in way to optimize force through UE/LEs. Patient's current d/c plan remains appropriate to improve her gait, muscle strength, and function.  Follow Up Recommendations  SNF     Equipment Recommendations  Rolling walker with 5" wheels    Recommendations for Other Services       Precautions / Restrictions Precautions Precautions: Posterior Hip;Fall Precaution Booklet Issued: Yes (comment) Restrictions Weight Bearing Restrictions: Yes RLE Weight Bearing: Partial weight bearing RLE Partial Weight Bearing Percentage or Pounds: 50    Mobility  Bed Mobility                  Transfers Overall transfer level: Needs assistance Equipment used: Rolling walker (2 wheeled) Transfers: Sit to/from Stand Sit to Stand: Min assist         General transfer comment: Patient requires assistance into standing to maintain balance but is able to maintain posterior hip precautions/WB restrictions independently.  Ambulation/Gait Ambulation/Gait assistance: Min assist Ambulation Distance (Feet): 80 Feet Assistive device: Rolling walker (2 wheeled) Gait Pattern/deviations: Step-to pattern     General Gait Details: Patient has tendency to shrug shoulders as pushing through UEs to maintain WB restrictions. Patient required verbal cues to keep RW at appropriate distance in order to step to.   Stairs             Wheelchair Mobility    Modified Rankin (Stroke Patients Only)       Balance                                    Cognition Arousal/Alertness: Awake/alert Behavior During Therapy: WFL for tasks assessed/performed Overall Cognitive Status: Within Functional Limits for tasks assessed                      Exercises Total Joint Exercises Ankle Circles/Pumps: AROM;20 reps;Seated Quad Sets: AROM;20 reps;Seated Gluteal Sets: AROM;20 reps;Seated Hip ABduction/ADduction: AAROM;15 reps;Seated Long Arc Quad: AROM;15 reps;Seated    General Comments        Pertinent Vitals/Pain Pain Assessment: 0-10 Pain Score: 7  Pain Location: R hip Pain Descriptors / Indicators: Aching Pain Intervention(s): Limited activity within patient's tolerance;Monitored during session    Home Living                      Prior Function            PT Goals (current goals can now be found in the care plan section) Acute Rehab PT Goals Patient Stated Goal: To return to PLOF PT Goal Formulation: With patient Time For Goal Achievement: 09/24/15 Potential to Achieve Goals: Good Progress towards PT goals: Progressing toward goals    Frequency  BID    PT Plan Current plan remains appropriate    Co-evaluation  End of Session Equipment Utilized During Treatment: Gait belt Activity Tolerance: Patient tolerated treatment well Patient left: in chair;with call bell/phone within reach;with chair alarm set     Time: 5093-2671 PT Time Calculation (min) (ACUTE ONLY): 27 min  Charges:  $Gait Training: 8-22 mins $Therapeutic Exercise: 8-22 mins                    G Codes:      Dorice Lamas, PT, DPT 09/13/2015, 11:01 AM

## 2015-09-13 NOTE — Progress Notes (Signed)
Discharge Note:  Pts VSS, report called to [place] at [facility].  EMS transporting pt.

## 2015-09-13 NOTE — Clinical Social Work Note (Signed)
CSW notified pt, pt's daughter, RN and facility that pt would DC today via EMS to [facility].  CSW signing off

## 2015-09-13 NOTE — Progress Notes (Signed)
Pt. Called out with pain score 9 out of 10 in right hip. I repositioned her and checked her tens unit which was not working. I changed batteries and pads. It's still not working. Will report off to oncoming nurse. PO pain meds administered for pain. Will continue to monitor.

## 2016-09-30 ENCOUNTER — Other Ambulatory Visit: Payer: Self-pay | Admitting: Family Medicine

## 2016-09-30 DIAGNOSIS — Z1231 Encounter for screening mammogram for malignant neoplasm of breast: Secondary | ICD-10-CM

## 2016-11-03 ENCOUNTER — Ambulatory Visit: Payer: Medicare Other

## 2016-11-09 ENCOUNTER — Ambulatory Visit
Admission: RE | Admit: 2016-11-09 | Discharge: 2016-11-09 | Disposition: A | Discharge status: Home / Self Care | Payer: Medicare Other | Source: Ambulatory Visit | Attending: Family Medicine | Admitting: Family Medicine

## 2016-11-09 DIAGNOSIS — Z1231 Encounter for screening mammogram for malignant neoplasm of breast: Secondary | ICD-10-CM | POA: Insufficient documentation

## 2017-07-25 ENCOUNTER — Other Ambulatory Visit: Payer: Self-pay | Admitting: Nurse Practitioner

## 2017-07-25 DIAGNOSIS — Z1231 Encounter for screening mammogram for malignant neoplasm of breast: Secondary | ICD-10-CM

## 2017-11-14 ENCOUNTER — Ambulatory Visit
Admission: RE | Admit: 2017-11-14 | Discharge: 2017-11-14 | Disposition: A | Discharge status: Home / Self Care | Payer: Medicare Other | Source: Ambulatory Visit | Attending: Nurse Practitioner | Admitting: Nurse Practitioner

## 2017-11-14 DIAGNOSIS — Z1231 Encounter for screening mammogram for malignant neoplasm of breast: Secondary | ICD-10-CM | POA: Diagnosis not present

## 2018-02-06 ENCOUNTER — Encounter: Payer: Self-pay | Admitting: *Deleted

## 2018-02-07 ENCOUNTER — Encounter
Admission: RE | Disposition: A | Discharge status: Home / Self Care | Payer: Self-pay | Source: Ambulatory Visit | Attending: Gastroenterology

## 2018-02-07 ENCOUNTER — Ambulatory Visit: Payer: Medicare Other | Admitting: Certified Registered Nurse Anesthetist

## 2018-02-07 ENCOUNTER — Ambulatory Visit
Admission: RE | Admit: 2018-02-07 | Discharge: 2018-02-07 | Disposition: A | Discharge status: Home / Self Care | Payer: Medicare Other | Source: Ambulatory Visit | Attending: Gastroenterology | Admitting: Gastroenterology

## 2018-02-07 ENCOUNTER — Encounter: Payer: Self-pay | Admitting: *Deleted

## 2018-02-07 ENCOUNTER — Other Ambulatory Visit: Payer: Self-pay

## 2018-02-07 DIAGNOSIS — Z85118 Personal history of other malignant neoplasm of bronchus and lung: Secondary | ICD-10-CM | POA: Insufficient documentation

## 2018-02-07 DIAGNOSIS — M069 Rheumatoid arthritis, unspecified: Secondary | ICD-10-CM | POA: Insufficient documentation

## 2018-02-07 DIAGNOSIS — K621 Rectal polyp: Secondary | ICD-10-CM | POA: Insufficient documentation

## 2018-02-07 DIAGNOSIS — I129 Hypertensive chronic kidney disease with stage 1 through stage 4 chronic kidney disease, or unspecified chronic kidney disease: Secondary | ICD-10-CM | POA: Insufficient documentation

## 2018-02-07 DIAGNOSIS — K635 Polyp of colon: Secondary | ICD-10-CM | POA: Diagnosis not present

## 2018-02-07 DIAGNOSIS — E785 Hyperlipidemia, unspecified: Secondary | ICD-10-CM | POA: Diagnosis not present

## 2018-02-07 DIAGNOSIS — M1990 Unspecified osteoarthritis, unspecified site: Secondary | ICD-10-CM | POA: Insufficient documentation

## 2018-02-07 DIAGNOSIS — Z96641 Presence of right artificial hip joint: Secondary | ICD-10-CM | POA: Diagnosis not present

## 2018-02-07 DIAGNOSIS — Z7982 Long term (current) use of aspirin: Secondary | ICD-10-CM | POA: Insufficient documentation

## 2018-02-07 DIAGNOSIS — Z79899 Other long term (current) drug therapy: Secondary | ICD-10-CM | POA: Insufficient documentation

## 2018-02-07 DIAGNOSIS — Z1211 Encounter for screening for malignant neoplasm of colon: Secondary | ICD-10-CM | POA: Diagnosis not present

## 2018-02-07 DIAGNOSIS — N189 Chronic kidney disease, unspecified: Secondary | ICD-10-CM | POA: Insufficient documentation

## 2018-02-07 HISTORY — DX: Heart disease, unspecified: I51.9

## 2018-02-07 HISTORY — PX: COLONOSCOPY WITH PROPOFOL: SHX5780

## 2018-02-07 HISTORY — DX: Hyperlipidemia, unspecified: E78.5

## 2018-02-07 HISTORY — DX: Malignant (primary) neoplasm, unspecified: C80.1

## 2018-02-07 HISTORY — DX: Chronic kidney disease, unspecified: N18.9

## 2018-02-07 SURGERY — COLONOSCOPY WITH PROPOFOL
Anesthesia: Monitor Anesthesia Care

## 2018-02-07 MED ORDER — LIDOCAINE HCL (CARDIAC) 20 MG/ML IV SOLN
INTRAVENOUS | Status: DC | PRN
  Administered 2018-02-07: 25 mg via INTRAVENOUS

## 2018-02-07 MED ORDER — PROPOFOL 10 MG/ML IV BOLUS
INTRAVENOUS | Status: AC
  Filled 2018-02-07: qty 20

## 2018-02-07 MED ORDER — LIDOCAINE HCL (PF) 2 % IJ SOLN
INTRAMUSCULAR | Status: AC
Start: 2018-02-07 — End: 2018-02-07
  Filled 2018-02-07: qty 10

## 2018-02-07 MED ORDER — PROPOFOL 10 MG/ML IV BOLUS
INTRAVENOUS | Status: DC | PRN
  Administered 2018-02-07: 20 mg via INTRAVENOUS
  Administered 2018-02-07: 60 mg via INTRAVENOUS
  Administered 2018-02-07: 20 mg via INTRAVENOUS
  Administered 2018-02-07: 40 mg via INTRAVENOUS
  Administered 2018-02-07: 20 mg via INTRAVENOUS

## 2018-02-07 MED ORDER — SODIUM CHLORIDE 0.9 % IV SOLN
INTRAVENOUS | Status: DC
  Administered 2018-02-07: 08:00:00 via INTRAVENOUS

## 2018-02-07 MED ORDER — PROPOFOL 500 MG/50ML IV EMUL: INTRAVENOUS | Status: DC | PRN

## 2018-02-07 NOTE — Anesthesia Preprocedure Evaluation (Signed)
Anesthesia Evaluation  Patient identified by MRN, date of birth, ID band Patient awake    Reviewed: Allergy & Precautions, H&P , NPO status , Patient's Chart, lab work & pertinent test results, reviewed documented beta blocker date and time   Airway Mallampati: III   Neck ROM: full    Dental  (+) Poor Dentition   Pulmonary neg pulmonary ROS,    Pulmonary exam normal        Cardiovascular Exercise Tolerance: Poor hypertension, On Medications negative cardio ROS Normal cardiovascular exam Rhythm:regular Rate:Normal     Neuro/Psych negative neurological ROS  negative psych ROS   GI/Hepatic negative GI ROS, Neg liver ROS,   Endo/Other  negative endocrine ROS  Renal/GU Renal diseasenegative Renal ROS  negative genitourinary   Musculoskeletal   Abdominal   Peds  Hematology negative hematology ROS (+)   Anesthesia Other Findings Past Medical History: No date: Arthritis     Comment:  Osteoarthritis and Rheumatoid  No date: Cancer ([place])     Comment:  Lung  No date: Chronic kidney disease No date: Heart disease No date: Hyperlipidemia No date: Hypertension Past Surgical History: No date: fatty tumor removed 09/10/2015: TOTAL HIP ARTHROPLASTY; Right     Comment:  Procedure: TOTAL HIP ARTHROPLASTY;  Surgeon: Earnestine Leys, MD;  Location: [facility] ORS;  Service: Orthopedics;                Laterality: Right; No date: TUBAL LIGATION BMI    Body Mass Index:  35.36 kg/m     Reproductive/Obstetrics negative OB ROS                             Anesthesia Physical Anesthesia Plan  ASA: III  Anesthesia Plan: General   Post-op Pain Management:    Induction:   PONV Risk Score and Plan:   Airway Management Planned:   Additional Equipment:   Intra-op Plan:   Post-operative Plan:   Informed Consent: I have reviewed the patients History and Physical, chart, labs and discussed  the procedure including the risks, benefits and alternatives for the proposed anesthesia with the patient or authorized representative who has indicated his/her understanding and acceptance.   Dental Advisory Given  Plan Discussed with: CRNA  Anesthesia Plan Comments:         Anesthesia Quick Evaluation

## 2018-02-07 NOTE — Op Note (Signed)
[facility] Gastroenterology Patient Name: Cristina Roberts Procedure Date: 02/07/2018 7:46 AM MRN: [id number] Account #: [id number] Date of Birth: 08/15/1946 Admit Type: Outpatient Age: 72 Room: [facility] ENDO ROOM 3 Gender: Female Note Status: Finalized Procedure:            Colonoscopy Indications:          Screening for colorectal malignant neoplasm Providers:            Lollie Sails, MD Referring MD:         Ivin Poot. Matthew Saras (Referring MD) Medicines:            Monitored Anesthesia Care Complications:        No immediate complications. Procedure:            Pre-Anesthesia Assessment:                       - ASA Grade Assessment: III - A patient with severe                        systemic disease.                       After obtaining informed consent, the colonoscope was                        passed under direct vision. Throughout the procedure,                        the patient's blood pressure, pulse, and oxygen                        saturations were monitored continuously. The                        Colonoscope was introduced through the anus and                        advanced to the the cecum, identified by appendiceal                        orifice and ileocecal valve. The colonoscopy was                        performed without difficulty. The patient tolerated the                        procedure well. The quality of the bowel preparation                        was good. Findings:      Two sessile polyps were found in the hepatic flexure. The polyps were 1       mm in size. These polyps were removed with a cold biopsy forceps.       Resection and retrieval were complete.      A 1 mm polyp was found in the sigmoid colon distal sigmoid colon. The       polyp was sessile. The polyp was removed with a cold biopsy forceps.       Resection and retrieval were complete.      A 1 mm polyp was found in the rectum. The polyp  was sessile. The polyp    was removed with a cold biopsy forceps. Resection and retrieval were       complete.      The retroflexed view of the distal rectum and anal verge was normal and       showed no anal or rectal abnormalities.      The digital rectal exam findings include multiple external skin tags.      The exam was otherwise without abnormality. Impression:           - Two 1 mm polyps at the hepatic flexure, removed with                        a cold biopsy forceps. Resected and retrieved.                       - One 1 mm polyp in the sigmoid colon in the distal                        sigmoid colon, removed with a cold biopsy forceps.                        Resected and retrieved.                       - One 1 mm polyp in the rectum, removed with a cold                        biopsy forceps. Resected and retrieved.                       - The distal rectum and anal verge are normal on                        retroflexion view.                       - Multiple external skin tags. found on digital rectal                        exam.                       - The examination was otherwise normal. Recommendation:       - Discharge patient to home.                       - Advance diet as tolerated. Procedure Code(s):    --- Professional ---                       [phone], Colonoscopy, flexible; with biopsy, single or                        multiple Diagnosis Code(s):    --- Professional ---                       Z12.11, Encounter for screening for malignant neoplasm                        of colon  D12.3, Benign neoplasm of transverse colon (hepatic                        flexure or splenic flexure)                       D12.5, Benign neoplasm of sigmoid colon                       K62.1, Rectal polyp CPT copyright 2016 American Medical Association. All rights reserved. The codes documented in this report are preliminary and upon coder review may  be revised to meet current compliance  requirements. Lollie Sails, MD 02/07/2018 8:24:15 AM This report has been signed electronically. Number of Addenda: 0 Note Initiated On: 02/07/2018 7:46 AM Scope Withdrawal Time: 0 hours 10 minutes 56 seconds  Total Procedure Duration: 0 hours 25 minutes 20 seconds       [facility]

## 2018-02-07 NOTE — Anesthesia Post-op Follow-up Note (Signed)
Anesthesia QCDR form completed.        

## 2018-02-07 NOTE — Transfer of Care (Signed)
Immediate Anesthesia Transfer of Care Note  Patient: Cristina Roberts  Procedure(s) Performed: COLONOSCOPY WITH PROPOFOL (N/A )  Patient Location: PACU  Anesthesia Type:MAC  Level of Consciousness: awake  Airway & Oxygen Therapy: Patient Spontanous Breathing  Post-op Assessment: Report given to RN  Post vital signs: stable  Last Vitals:  Vitals:   02/07/18 0844 02/07/18 0854  BP: 124/70 138/74  Pulse:    Resp:    Temp:    SpO2:      Last Pain:  Vitals:   02/07/18 0826  TempSrc: Tympanic      Patients Stated Pain Goal: 8 (97/41/63 8453)  Complications: No apparent anesthesia complications

## 2018-02-07 NOTE — H&P (Signed)
Outpatient short stay form Pre-procedure 02/07/2018 9:28 AM Lollie Sails MD  Primary Physician: Margurite Auerbach NP  Reason for visit: Colonoscopy  History of present illness: Patient is a 72 year old female presenting today for colon cancer screening.  She tolerated prep well.  She takes no aspirin or blood thinning agent with the exception of 81 mg aspirin that she has held for a couple of days.    Current Facility-Administered Medications:  .  0.9 %  sodium chloride infusion, , Intravenous, Continuous, Lollie Sails, MD, Last Rate: 20 mL/hr at 02/07/18 0731  Current Outpatient Medications:  .  acetaminophen (TYLENOL) 500 MG tablet, Take 2 tablets (1,000 mg total) by mouth every 6 (six) hours as needed for moderate pain., Disp: 30 tablet, Rfl: 0 .  acetaminophen (TYLENOL) 650 MG suppository, Place 1 suppository (650 mg total) rectally every 6 (six) hours as needed for mild pain (or Fever >/= 101)., Disp: 12 suppository, Rfl: 0 .  alum & mag hydroxide-simeth (MAALOX/MYLANTA) 200-200-20 MG/5ML suspension, Take 30 mLs by mouth every 4 (four) hours as needed for indigestion., Disp: 355 mL, Rfl: 0 .  amLODipine (NORVASC) 5 MG tablet, Take 5 mg by mouth daily., Disp: , Rfl:  .  aspirin EC 325 MG tablet, Take 1 tablet (325 mg total) by mouth 2 (two) times daily. Take for 4 weeks (Patient taking differently: Take 81 mg by mouth 2 (two) times daily. Take for 4 weeks), Disp: 60 tablet, Rfl: 0 .  benazepril (LOTENSIN) 20 MG tablet, Take 1 tablet (20 mg total) by mouth daily., Disp: 60 tablet, Rfl: 2 .  benazepril-hydrochlorthiazide (LOTENSIN HCT) 20-25 MG per tablet, Take 1 tablet by mouth daily. , Disp: , Rfl:  .  bisacodyl (DULCOLAX) 10 MG suppository, Place 1 suppository (10 mg total) rectally daily as needed for moderate constipation., Disp: 12 suppository, Rfl: 0 .  Calcium Carbonate-Vitamin D (CALCIUM 600+D) 600-400 MG-UNIT per tablet, Take 1 tablet by mouth daily., Disp: , Rfl:  .   calcium-vitamin D (OSCAL WITH D) 500-200 MG-UNIT per tablet, Take 1 tablet by mouth daily., Disp: 60 tablet, Rfl: 2 .  celecoxib (CELEBREX) 200 MG capsule, Take 1 capsule (200 mg total) by mouth every 12 (twelve) hours., Disp: 30 capsule, Rfl: 2 .  cholecalciferol (VITAMIN D) 400 units TABS tablet, Take 400 Units by mouth., Disp: , Rfl:  .  diphenhydrAMINE (BENADRYL) 12.5 MG/5ML elixir, Take 5-10 mLs (12.5-25 mg total) by mouth every 4 (four) hours as needed for itching., Disp: 120 mL, Rfl: 0 .  ferrous sulfate 325 (65 FE) MG tablet, Take 1 tablet (325 mg total) by mouth daily with breakfast., Disp: 30 tablet, Rfl: 3 .  hydrochlorothiazide (HYDRODIURIL) 25 MG tablet, Take 1 tablet (25 mg total) by mouth daily., Disp: 60 tablet, Rfl: 2 .  HYDROcodone-acetaminophen (NORCO) 10-325 MG per tablet, Take 1-2 tablets by mouth every 4 (four) hours as needed (breakthrough pain)., Disp: 30 tablet, Rfl: 0 .  magnesium hydroxide (MILK OF MAGNESIA) 400 MG/5ML suspension, Take 30 mLs by mouth daily as needed for mild constipation., Disp: 360 mL, Rfl: 0 .  menthol-cetylpyridinium (CEPACOL) 3 MG lozenge, Take 1 lozenge (3 mg total) by mouth as needed for sore throat (sore throat)., Disp: 100 tablet, Rfl: 12 .  methocarbamol (ROBAXIN) 500 MG tablet, Take 1 tablet (500 mg total) by mouth every 6 (six) hours as needed for muscle spasms., Disp: 60 tablet, Rfl: 2 .  metoCLOPramide (REGLAN) 5 MG tablet, Take 1-2 tablets (5-10 mg total)  by mouth every 8 (eight) hours as needed for nausea (if ondansetron (ZOFRAN) ineffective.)., Disp: 30 tablet, Rfl: 3 .  morphine 2 MG/ML injection, Inject 1 mL (2 mg total) into the vein every 2 (two) hours as needed (unresolved breakthrough pain)., Disp: 1 mL, Rfl: 0 .  ondansetron (ZOFRAN) 4 MG tablet, Take 1 tablet (4 mg total) by mouth every 6 (six) hours as needed for nausea., Disp: 20 tablet, Rfl: 0 .  pregabalin (LYRICA) 75 MG capsule, Take 1 capsule (75 mg total) by mouth 2 (two) times  daily., Disp: 60 capsule, Rfl: 2 .  senna (SENOKOT) 8.6 MG TABS tablet, Take 1 tablet (8.6 mg total) by mouth 2 (two) times daily., Disp: 120 each, Rfl: 0 .  zolpidem (AMBIEN) 5 MG tablet, Take 1 tablet (5 mg total) by mouth at bedtime as needed for sleep., Disp: 30 tablet, Rfl: 0 .  amLODipine (NORVASC) 5 MG tablet, Take 1 tablet (5 mg total) by mouth daily., Disp: 60 tablet, Rfl: 2  No medications prior to admission.     No Known Allergies   Past Medical History:  Diagnosis Date  . Arthritis    Osteoarthritis and Rheumatoid   . 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 Chronic kidney disease   . Heart disease   . Hyperlipidemia   . Hypertension     Review of systems:      Physical Exam    Heart and lungs: Regular rate and rhythm without rub or gallop, lungs are bilaterally clear.    HEENT: Normocephalic atraumatic eyes are anicteric    Other:    Pertinant exam for procedure: Soft nontender nondistended bowel sounds positive normoactive.    Planned proceedures: Colonoscopy and indicated procedures. I have discussed the risks benefits and complications of procedures to include not limited to bleeding, infection, perforation and the risk of sedation and the patient wishes to proceed.    Lollie Sails, MD Gastroenterology 02/07/2018  9:28 AM

## 2018-02-08 ENCOUNTER — Encounter: Payer: Self-pay | Admitting: Gastroenterology

## 2018-02-08 LAB — SURGICAL PATHOLOGY

## 2018-02-08 NOTE — Anesthesia Postprocedure Evaluation (Signed)
Anesthesia Post Note  Patient: Alexiz Surveyor, mining  Procedure(s) Performed: COLONOSCOPY WITH PROPOFOL (N/A )  Patient location during evaluation: PACU Anesthesia Type: MAC Level of consciousness: awake and alert Pain management: pain level controlled Vital Signs Assessment: post-procedure vital signs reviewed and stable Respiratory status: spontaneous breathing, nonlabored ventilation, respiratory function stable and patient connected to nasal cannula oxygen Cardiovascular status: blood pressure returned to baseline and stable Postop Assessment: no apparent nausea or vomiting Anesthetic complications: no     Last Vitals:  Vitals:   02/07/18 0844 02/07/18 0854  BP: 124/70 138/74  Pulse:    Resp:    Temp:    SpO2:      Last Pain:  Vitals:   02/08/18 0728  TempSrc:   PainSc: 0-No pain                 Molli Barrows

## 2018-11-21 IMAGING — MG MM DIGITAL SCREENING BILAT W/ TOMO W/ CAD
8 of 13 series · 8 of 29 positions shown · non-contrast
Comparison: Previous exam(s).

CLINICAL DATA: Screening.

EXAM:
2D DIGITAL SCREENING BILATERAL MAMMOGRAM WITH CAD AND ADJUNCT TOMO

[L MLO (1 of 2)]
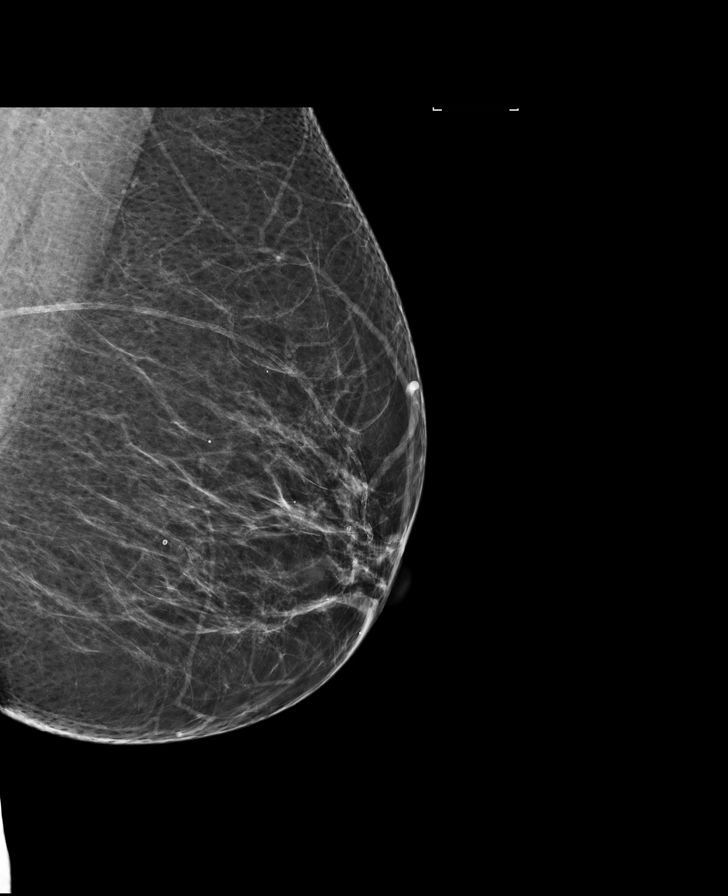

[R CC synth-2D]
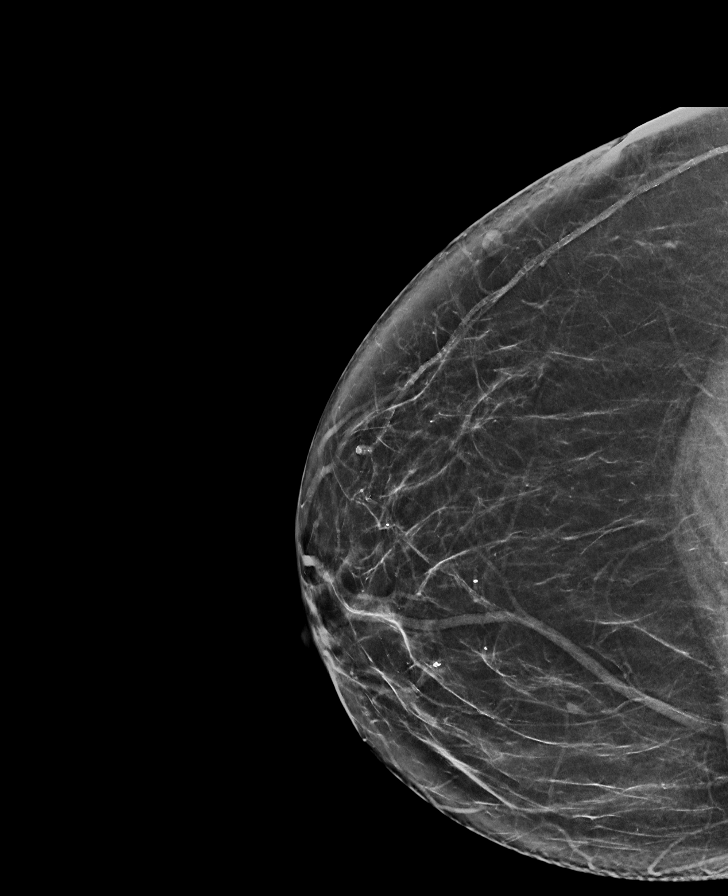

[R MLO synth-2D]
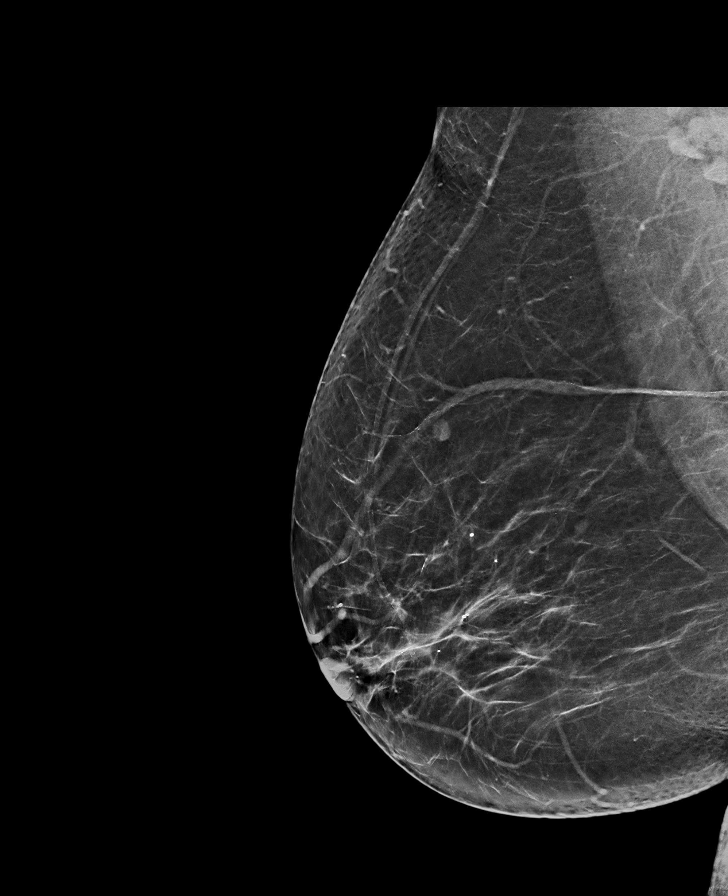

[L MLO (2 of 2)]
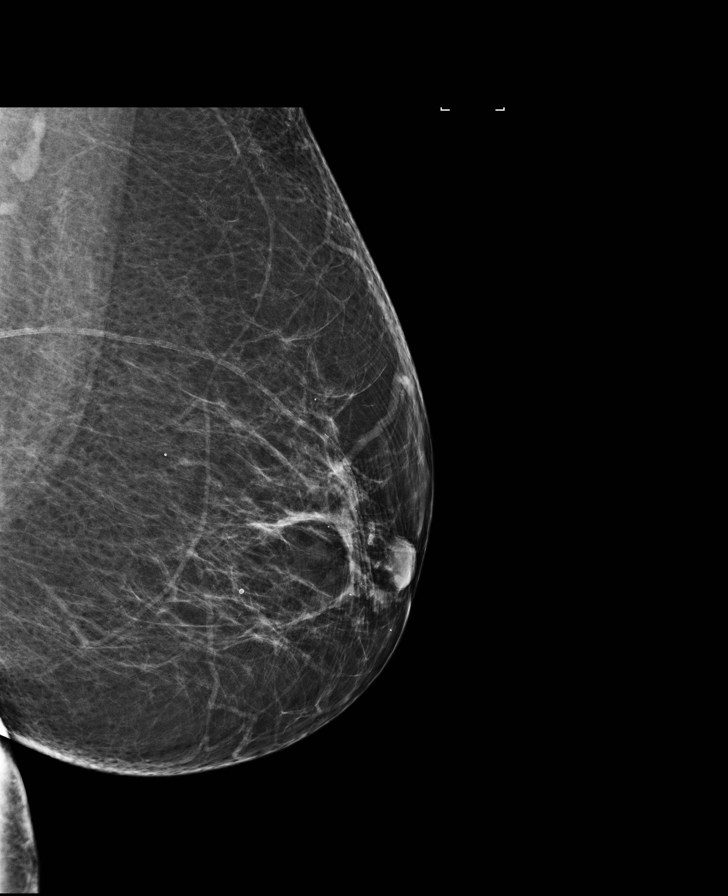

[L MLO synth-2D]
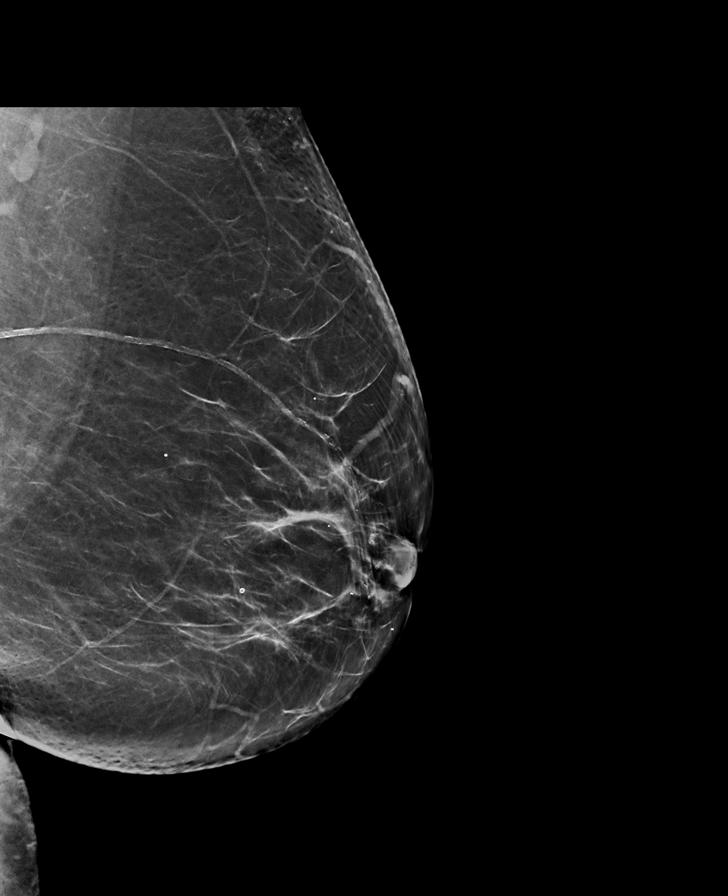

[L CC]
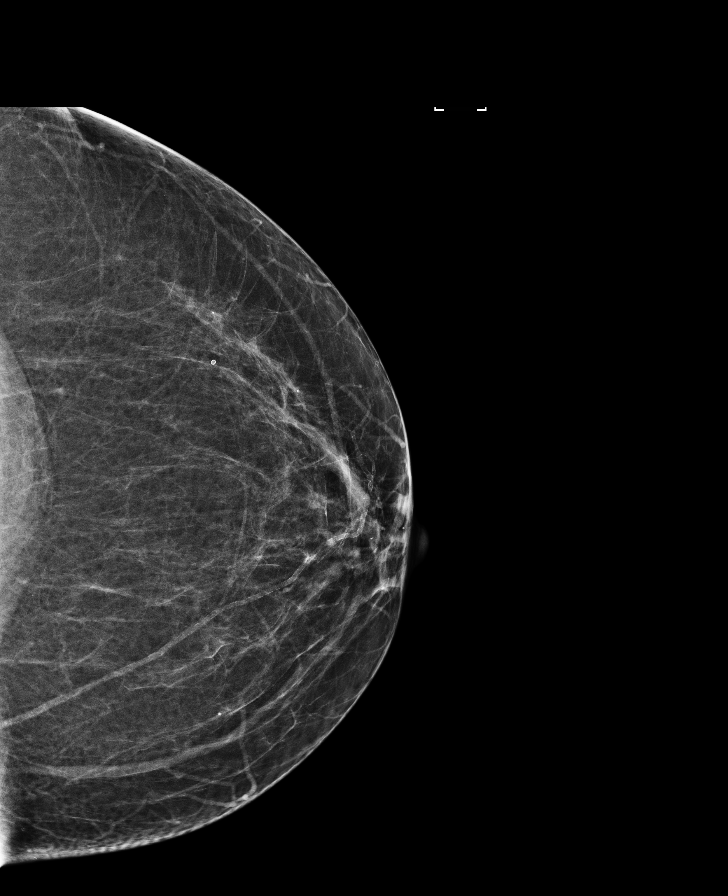

[R CC]
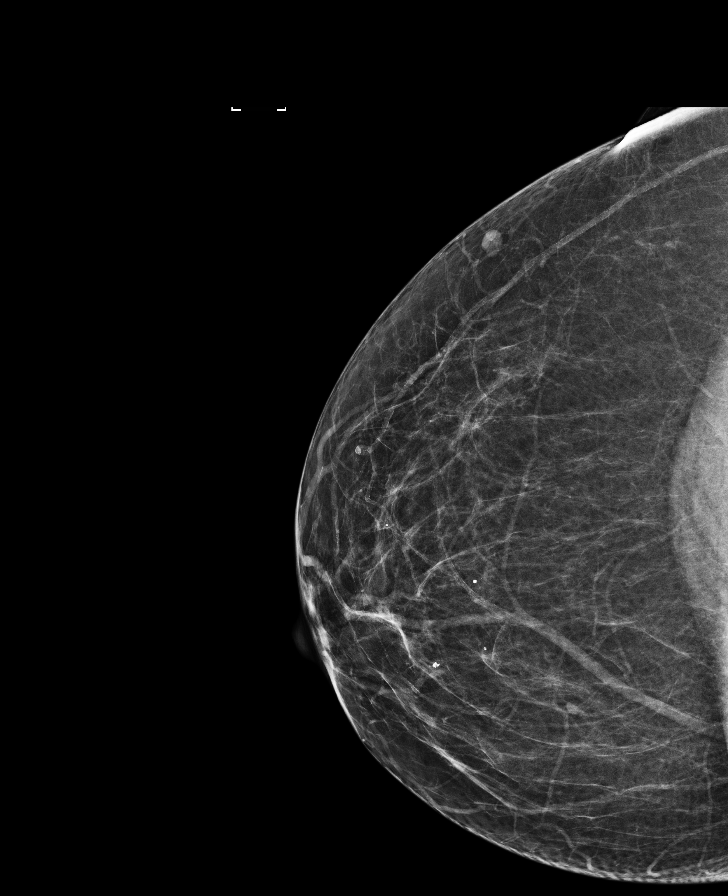

[L CC synth-2D]
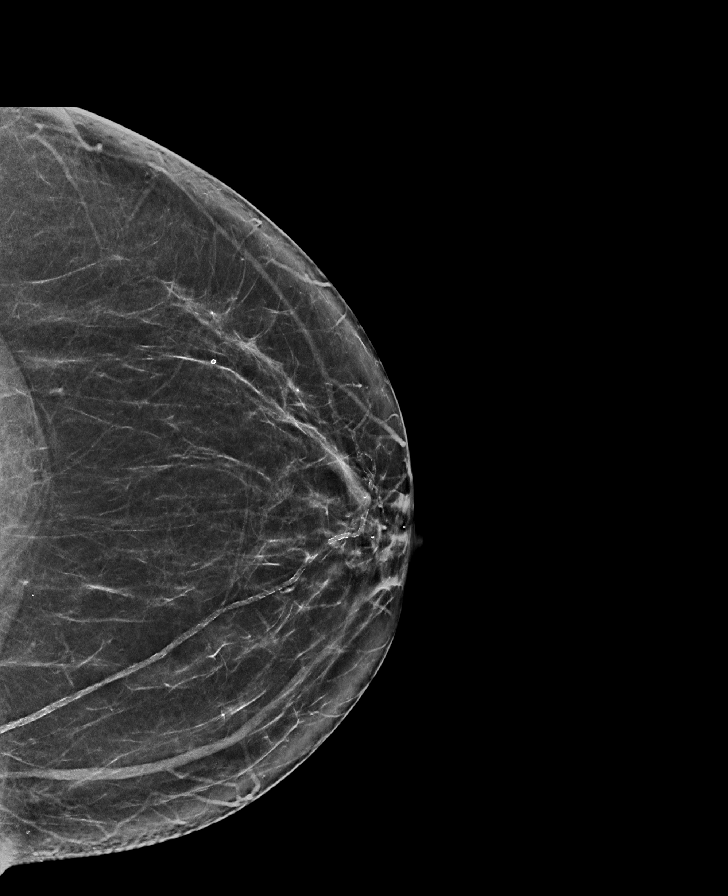

[8 of 29 positions shown; findings below may reference images not displayed]

ACR Breast Density Category b: There are scattered areas of
fibroglandular density.
FINDINGS: There are no findings suspicious for malignancy. Images were
processed with CAD.
IMPRESSION: No mammographic evidence of malignancy. A result letter of this
screening mammogram will be mailed directly to the patient.

RECOMMENDATION:
Screening mammogram in one year. (Code:97-6-RS4)

BI-RADS CATEGORY  1: Negative.

## 2019-01-24 ENCOUNTER — Other Ambulatory Visit: Payer: Self-pay | Admitting: Nurse Practitioner

## 2019-01-24 DIAGNOSIS — Z1231 Encounter for screening mammogram for malignant neoplasm of breast: Secondary | ICD-10-CM

## 2019-02-13 ENCOUNTER — Ambulatory Visit
Admission: RE | Admit: 2019-02-13 | Discharge: 2019-02-13 | Disposition: A | Discharge status: Home / Self Care | Payer: Medicare Other | Source: Ambulatory Visit | Attending: Nurse Practitioner | Admitting: Nurse Practitioner

## 2019-02-13 DIAGNOSIS — Z1231 Encounter for screening mammogram for malignant neoplasm of breast: Secondary | ICD-10-CM | POA: Diagnosis present

## 2020-02-04 ENCOUNTER — Ambulatory Visit: Payer: Medicare Other | Attending: Internal Medicine

## 2020-02-04 DIAGNOSIS — Z20822 Contact with and (suspected) exposure to covid-19: Secondary | ICD-10-CM

## 2020-02-05 LAB — NOVEL CORONAVIRUS, NAA: SARS-CoV-2, NAA: DETECTED — AB

## 2020-02-06 ENCOUNTER — Telehealth: Payer: Self-pay | Admitting: Physician Assistant

## 2020-02-06 ENCOUNTER — Other Ambulatory Visit: Payer: Self-pay

## 2020-02-06 NOTE — Telephone Encounter (Signed)
Called to discuss with Brinleigh Trenton Gammon about Covid symptoms and the use of bamlanivimab or casirivimab/imdevimab, a monoclonal antibody infusion for those with mild to moderate Covid symptoms and at a high risk of hospitalization.     Pt is qualified for this infusion at the [facility] infusion center due to co-morbid conditions and/or a member of an at-risk group (advanced age), however declines infusion at this time. She is just having mild sx of loss of taste. Symptoms tier reviewed as well as criteria for ending isolation.  Symptoms reviewed that would warrant ED/Hospital evaluation. Preventative practices reviewed. Patient verbalized understanding. Patient advised to call back if he decides that he does want to get infusion. Callback number to the infusion center given. Patient advised to go to Urgent care or ED with severe symptoms. Last date pt would be eligible for infusion is 02/12/20.    Patient Active Problem List   Diagnosis Date Noted  . S/P total hip arthroplasty 09/10/2015  . Tinea pedis of both feet 11/13/2014  . Peroneal tendonitis 11/13/2014    Angelena Form PA-C

## 2020-02-14 DIAGNOSIS — I38 Endocarditis, valve unspecified: Secondary | ICD-10-CM | POA: Insufficient documentation

## 2020-02-14 DIAGNOSIS — M069 Rheumatoid arthritis, unspecified: Secondary | ICD-10-CM | POA: Insufficient documentation

## 2020-06-24 ENCOUNTER — Other Ambulatory Visit: Payer: Self-pay | Admitting: Nurse Practitioner

## 2020-06-24 DIAGNOSIS — Z1231 Encounter for screening mammogram for malignant neoplasm of breast: Secondary | ICD-10-CM

## 2020-07-15 ENCOUNTER — Ambulatory Visit
Admission: RE | Admit: 2020-07-15 | Discharge: 2020-07-15 | Disposition: A | Discharge status: Home / Self Care | Payer: Medicare Other | Source: Ambulatory Visit | Attending: Nurse Practitioner | Admitting: Nurse Practitioner

## 2020-07-15 DIAGNOSIS — Z1231 Encounter for screening mammogram for malignant neoplasm of breast: Secondary | ICD-10-CM | POA: Diagnosis not present

## 2021-04-13 DIAGNOSIS — M1990 Unspecified osteoarthritis, unspecified site: Secondary | ICD-10-CM | POA: Diagnosis not present

## 2021-04-13 DIAGNOSIS — I10 Essential (primary) hypertension: Secondary | ICD-10-CM | POA: Diagnosis not present

## 2021-04-13 DIAGNOSIS — M542 Cervicalgia: Secondary | ICD-10-CM | POA: Diagnosis not present

## 2021-04-13 DIAGNOSIS — G4489 Other headache syndrome: Secondary | ICD-10-CM | POA: Diagnosis not present

## 2021-04-13 DIAGNOSIS — E785 Hyperlipidemia, unspecified: Secondary | ICD-10-CM | POA: Diagnosis not present

## 2021-04-16 DIAGNOSIS — I10 Essential (primary) hypertension: Secondary | ICD-10-CM | POA: Diagnosis not present

## 2021-04-16 DIAGNOSIS — I351 Nonrheumatic aortic (valve) insufficiency: Secondary | ICD-10-CM | POA: Diagnosis not present

## 2021-04-16 DIAGNOSIS — E7849 Other hyperlipidemia: Secondary | ICD-10-CM | POA: Diagnosis not present

## 2021-04-16 DIAGNOSIS — I2510 Atherosclerotic heart disease of native coronary artery without angina pectoris: Secondary | ICD-10-CM | POA: Diagnosis not present

## 2021-04-16 DIAGNOSIS — R0602 Shortness of breath: Secondary | ICD-10-CM | POA: Diagnosis not present

## 2021-04-22 DIAGNOSIS — L03116 Cellulitis of left lower limb: Secondary | ICD-10-CM | POA: Diagnosis not present

## 2021-04-23 DIAGNOSIS — E7849 Other hyperlipidemia: Secondary | ICD-10-CM | POA: Diagnosis not present

## 2021-04-23 DIAGNOSIS — I10 Essential (primary) hypertension: Secondary | ICD-10-CM | POA: Diagnosis not present

## 2021-04-23 DIAGNOSIS — E559 Vitamin D deficiency, unspecified: Secondary | ICD-10-CM | POA: Diagnosis not present

## 2021-04-23 DIAGNOSIS — L03116 Cellulitis of left lower limb: Secondary | ICD-10-CM | POA: Diagnosis not present

## 2021-04-27 DIAGNOSIS — M1990 Unspecified osteoarthritis, unspecified site: Secondary | ICD-10-CM | POA: Diagnosis not present

## 2021-04-27 DIAGNOSIS — E7849 Other hyperlipidemia: Secondary | ICD-10-CM | POA: Diagnosis not present

## 2021-04-27 DIAGNOSIS — R7303 Prediabetes: Secondary | ICD-10-CM | POA: Diagnosis not present

## 2021-04-27 DIAGNOSIS — I10 Essential (primary) hypertension: Secondary | ICD-10-CM | POA: Diagnosis not present

## 2021-04-27 DIAGNOSIS — L03116 Cellulitis of left lower limb: Secondary | ICD-10-CM | POA: Diagnosis not present

## 2021-06-23 DIAGNOSIS — E7849 Other hyperlipidemia: Secondary | ICD-10-CM | POA: Diagnosis not present

## 2021-06-23 DIAGNOSIS — R7303 Prediabetes: Secondary | ICD-10-CM | POA: Diagnosis not present

## 2021-06-23 DIAGNOSIS — I10 Essential (primary) hypertension: Secondary | ICD-10-CM | POA: Diagnosis not present

## 2021-06-26 ENCOUNTER — Other Ambulatory Visit: Payer: Self-pay | Admitting: Nurse Practitioner

## 2021-06-26 DIAGNOSIS — E7849 Other hyperlipidemia: Secondary | ICD-10-CM | POA: Diagnosis not present

## 2021-06-26 DIAGNOSIS — Z1231 Encounter for screening mammogram for malignant neoplasm of breast: Secondary | ICD-10-CM

## 2021-06-26 DIAGNOSIS — G4489 Other headache syndrome: Secondary | ICD-10-CM | POA: Diagnosis not present

## 2021-06-26 DIAGNOSIS — I10 Essential (primary) hypertension: Secondary | ICD-10-CM | POA: Diagnosis not present

## 2021-06-26 DIAGNOSIS — R7303 Prediabetes: Secondary | ICD-10-CM | POA: Diagnosis not present

## 2021-07-09 DIAGNOSIS — G4489 Other headache syndrome: Secondary | ICD-10-CM | POA: Diagnosis not present

## 2021-07-09 DIAGNOSIS — G44329 Chronic post-traumatic headache, not intractable: Secondary | ICD-10-CM | POA: Diagnosis not present

## 2021-07-09 DIAGNOSIS — R7303 Prediabetes: Secondary | ICD-10-CM | POA: Diagnosis not present

## 2021-07-09 DIAGNOSIS — R946 Abnormal results of thyroid function studies: Secondary | ICD-10-CM | POA: Diagnosis not present

## 2021-08-04 DIAGNOSIS — Z13820 Encounter for screening for osteoporosis: Secondary | ICD-10-CM | POA: Diagnosis not present

## 2021-08-04 DIAGNOSIS — M8589 Other specified disorders of bone density and structure, multiple sites: Secondary | ICD-10-CM | POA: Diagnosis not present

## 2021-08-17 DIAGNOSIS — E7849 Other hyperlipidemia: Secondary | ICD-10-CM | POA: Diagnosis not present

## 2021-08-17 DIAGNOSIS — I10 Essential (primary) hypertension: Secondary | ICD-10-CM | POA: Diagnosis not present

## 2021-08-17 DIAGNOSIS — I2510 Atherosclerotic heart disease of native coronary artery without angina pectoris: Secondary | ICD-10-CM | POA: Diagnosis not present

## 2021-08-17 DIAGNOSIS — I351 Nonrheumatic aortic (valve) insufficiency: Secondary | ICD-10-CM | POA: Diagnosis not present

## 2021-08-17 DIAGNOSIS — I340 Nonrheumatic mitral (valve) insufficiency: Secondary | ICD-10-CM | POA: Diagnosis not present

## 2021-10-05 DIAGNOSIS — M1990 Unspecified osteoarthritis, unspecified site: Secondary | ICD-10-CM | POA: Diagnosis not present

## 2021-10-05 DIAGNOSIS — M25571 Pain in right ankle and joints of right foot: Secondary | ICD-10-CM | POA: Diagnosis not present

## 2021-10-05 DIAGNOSIS — I10 Essential (primary) hypertension: Secondary | ICD-10-CM | POA: Diagnosis not present

## 2021-10-23 DIAGNOSIS — I10 Essential (primary) hypertension: Secondary | ICD-10-CM | POA: Diagnosis not present

## 2021-10-23 DIAGNOSIS — R7303 Prediabetes: Secondary | ICD-10-CM | POA: Diagnosis not present

## 2021-10-23 DIAGNOSIS — E7849 Other hyperlipidemia: Secondary | ICD-10-CM | POA: Diagnosis not present

## 2021-12-03 DIAGNOSIS — R7303 Prediabetes: Secondary | ICD-10-CM | POA: Diagnosis not present

## 2021-12-03 DIAGNOSIS — M25571 Pain in right ankle and joints of right foot: Secondary | ICD-10-CM | POA: Diagnosis not present

## 2021-12-03 DIAGNOSIS — E7849 Other hyperlipidemia: Secondary | ICD-10-CM | POA: Diagnosis not present

## 2021-12-03 DIAGNOSIS — I10 Essential (primary) hypertension: Secondary | ICD-10-CM | POA: Diagnosis not present

## 2021-12-24 DIAGNOSIS — I351 Nonrheumatic aortic (valve) insufficiency: Secondary | ICD-10-CM | POA: Diagnosis not present

## 2021-12-24 DIAGNOSIS — I10 Essential (primary) hypertension: Secondary | ICD-10-CM | POA: Diagnosis not present

## 2021-12-24 DIAGNOSIS — I340 Nonrheumatic mitral (valve) insufficiency: Secondary | ICD-10-CM | POA: Diagnosis not present

## 2021-12-24 DIAGNOSIS — I2510 Atherosclerotic heart disease of native coronary artery without angina pectoris: Secondary | ICD-10-CM | POA: Diagnosis not present

## 2021-12-24 DIAGNOSIS — R0602 Shortness of breath: Secondary | ICD-10-CM | POA: Diagnosis not present

## 2021-12-24 DIAGNOSIS — I25118 Atherosclerotic heart disease of native coronary artery with other forms of angina pectoris: Secondary | ICD-10-CM | POA: Diagnosis not present

## 2021-12-24 DIAGNOSIS — E7849 Other hyperlipidemia: Secondary | ICD-10-CM | POA: Diagnosis not present

## 2022-01-01 DIAGNOSIS — I25118 Atherosclerotic heart disease of native coronary artery with other forms of angina pectoris: Secondary | ICD-10-CM | POA: Diagnosis not present

## 2022-01-01 DIAGNOSIS — R0600 Dyspnea, unspecified: Secondary | ICD-10-CM | POA: Diagnosis not present

## 2022-01-05 DIAGNOSIS — I25118 Atherosclerotic heart disease of native coronary artery with other forms of angina pectoris: Secondary | ICD-10-CM | POA: Diagnosis not present

## 2022-01-07 DIAGNOSIS — I351 Nonrheumatic aortic (valve) insufficiency: Secondary | ICD-10-CM | POA: Diagnosis not present

## 2022-01-07 DIAGNOSIS — R079 Chest pain, unspecified: Secondary | ICD-10-CM | POA: Diagnosis not present

## 2022-01-07 DIAGNOSIS — K219 Gastro-esophageal reflux disease without esophagitis: Secondary | ICD-10-CM | POA: Diagnosis not present

## 2022-01-07 DIAGNOSIS — I10 Essential (primary) hypertension: Secondary | ICD-10-CM | POA: Diagnosis not present

## 2022-01-07 DIAGNOSIS — I2510 Atherosclerotic heart disease of native coronary artery without angina pectoris: Secondary | ICD-10-CM | POA: Diagnosis not present

## 2022-01-07 DIAGNOSIS — E7849 Other hyperlipidemia: Secondary | ICD-10-CM | POA: Diagnosis not present

## 2022-01-07 DIAGNOSIS — I340 Nonrheumatic mitral (valve) insufficiency: Secondary | ICD-10-CM | POA: Diagnosis not present

## 2022-02-16 DIAGNOSIS — E7849 Other hyperlipidemia: Secondary | ICD-10-CM | POA: Diagnosis not present

## 2022-02-16 DIAGNOSIS — I340 Nonrheumatic mitral (valve) insufficiency: Secondary | ICD-10-CM | POA: Diagnosis not present

## 2022-02-16 DIAGNOSIS — I2510 Atherosclerotic heart disease of native coronary artery without angina pectoris: Secondary | ICD-10-CM | POA: Diagnosis not present

## 2022-02-16 DIAGNOSIS — I10 Essential (primary) hypertension: Secondary | ICD-10-CM | POA: Diagnosis not present

## 2022-02-16 DIAGNOSIS — I351 Nonrheumatic aortic (valve) insufficiency: Secondary | ICD-10-CM | POA: Diagnosis not present

## 2022-03-01 DIAGNOSIS — R7303 Prediabetes: Secondary | ICD-10-CM | POA: Diagnosis not present

## 2022-03-01 DIAGNOSIS — E7849 Other hyperlipidemia: Secondary | ICD-10-CM | POA: Diagnosis not present

## 2022-03-01 DIAGNOSIS — I10 Essential (primary) hypertension: Secondary | ICD-10-CM | POA: Diagnosis not present

## 2022-03-04 DIAGNOSIS — E079 Disorder of thyroid, unspecified: Secondary | ICD-10-CM | POA: Diagnosis not present

## 2022-03-04 DIAGNOSIS — E7849 Other hyperlipidemia: Secondary | ICD-10-CM | POA: Diagnosis not present

## 2022-03-04 DIAGNOSIS — R7303 Prediabetes: Secondary | ICD-10-CM | POA: Diagnosis not present

## 2022-03-04 DIAGNOSIS — K5900 Constipation, unspecified: Secondary | ICD-10-CM | POA: Diagnosis not present

## 2022-03-04 DIAGNOSIS — I10 Essential (primary) hypertension: Secondary | ICD-10-CM | POA: Diagnosis not present

## 2022-04-07 DIAGNOSIS — E079 Disorder of thyroid, unspecified: Secondary | ICD-10-CM | POA: Diagnosis not present

## 2022-04-09 DIAGNOSIS — I2510 Atherosclerotic heart disease of native coronary artery without angina pectoris: Secondary | ICD-10-CM | POA: Diagnosis not present

## 2022-04-09 DIAGNOSIS — I10 Essential (primary) hypertension: Secondary | ICD-10-CM | POA: Diagnosis not present

## 2022-04-09 DIAGNOSIS — R609 Edema, unspecified: Secondary | ICD-10-CM | POA: Diagnosis not present

## 2022-04-09 DIAGNOSIS — I351 Nonrheumatic aortic (valve) insufficiency: Secondary | ICD-10-CM | POA: Diagnosis not present

## 2022-04-09 DIAGNOSIS — E7849 Other hyperlipidemia: Secondary | ICD-10-CM | POA: Diagnosis not present

## 2022-04-09 DIAGNOSIS — I340 Nonrheumatic mitral (valve) insufficiency: Secondary | ICD-10-CM | POA: Diagnosis not present

## 2022-05-14 DIAGNOSIS — I351 Nonrheumatic aortic (valve) insufficiency: Secondary | ICD-10-CM | POA: Diagnosis not present

## 2022-05-14 DIAGNOSIS — E785 Hyperlipidemia, unspecified: Secondary | ICD-10-CM | POA: Diagnosis not present

## 2022-05-14 DIAGNOSIS — I2510 Atherosclerotic heart disease of native coronary artery without angina pectoris: Secondary | ICD-10-CM | POA: Diagnosis not present

## 2022-05-14 DIAGNOSIS — I340 Nonrheumatic mitral (valve) insufficiency: Secondary | ICD-10-CM | POA: Diagnosis not present

## 2022-05-14 DIAGNOSIS — I10 Essential (primary) hypertension: Secondary | ICD-10-CM | POA: Diagnosis not present

## 2022-05-14 DIAGNOSIS — E7849 Other hyperlipidemia: Secondary | ICD-10-CM | POA: Diagnosis not present

## 2022-07-15 DIAGNOSIS — E7849 Other hyperlipidemia: Secondary | ICD-10-CM | POA: Diagnosis not present

## 2022-07-15 DIAGNOSIS — R7303 Prediabetes: Secondary | ICD-10-CM | POA: Diagnosis not present

## 2022-07-15 DIAGNOSIS — I10 Essential (primary) hypertension: Secondary | ICD-10-CM | POA: Diagnosis not present

## 2022-07-19 DIAGNOSIS — K5900 Constipation, unspecified: Secondary | ICD-10-CM | POA: Diagnosis not present

## 2022-07-19 DIAGNOSIS — I10 Essential (primary) hypertension: Secondary | ICD-10-CM | POA: Diagnosis not present

## 2022-07-19 DIAGNOSIS — E7849 Other hyperlipidemia: Secondary | ICD-10-CM | POA: Diagnosis not present

## 2022-07-19 DIAGNOSIS — R5383 Other fatigue: Secondary | ICD-10-CM | POA: Diagnosis not present

## 2022-07-19 DIAGNOSIS — R7303 Prediabetes: Secondary | ICD-10-CM | POA: Diagnosis not present

## 2022-08-16 DIAGNOSIS — I340 Nonrheumatic mitral (valve) insufficiency: Secondary | ICD-10-CM | POA: Diagnosis not present

## 2022-08-16 DIAGNOSIS — I10 Essential (primary) hypertension: Secondary | ICD-10-CM | POA: Diagnosis not present

## 2022-08-16 DIAGNOSIS — I2510 Atherosclerotic heart disease of native coronary artery without angina pectoris: Secondary | ICD-10-CM | POA: Diagnosis not present

## 2022-08-16 DIAGNOSIS — E7849 Other hyperlipidemia: Secondary | ICD-10-CM | POA: Diagnosis not present

## 2022-08-16 DIAGNOSIS — I351 Nonrheumatic aortic (valve) insufficiency: Secondary | ICD-10-CM | POA: Diagnosis not present

## 2022-09-23 ENCOUNTER — Other Ambulatory Visit (INDEPENDENT_AMBULATORY_CARE_PROVIDER_SITE_OTHER): Payer: Self-pay | Admitting: Cardiology

## 2022-09-23 ENCOUNTER — Other Ambulatory Visit (INDEPENDENT_AMBULATORY_CARE_PROVIDER_SITE_OTHER): Payer: Self-pay | Admitting: Cardiovascular Disease

## 2022-09-23 DIAGNOSIS — R238 Other skin changes: Secondary | ICD-10-CM

## 2022-09-23 DIAGNOSIS — R600 Localized edema: Secondary | ICD-10-CM

## 2022-09-24 ENCOUNTER — Encounter (INDEPENDENT_AMBULATORY_CARE_PROVIDER_SITE_OTHER): Payer: Medicare Other

## 2022-11-10 DIAGNOSIS — R7303 Prediabetes: Secondary | ICD-10-CM | POA: Diagnosis not present

## 2022-11-10 DIAGNOSIS — R5383 Other fatigue: Secondary | ICD-10-CM | POA: Diagnosis not present

## 2022-11-10 DIAGNOSIS — I10 Essential (primary) hypertension: Secondary | ICD-10-CM | POA: Diagnosis not present

## 2022-11-10 DIAGNOSIS — E7849 Other hyperlipidemia: Secondary | ICD-10-CM | POA: Diagnosis not present

## 2022-11-16 DIAGNOSIS — R0602 Shortness of breath: Secondary | ICD-10-CM | POA: Diagnosis not present

## 2022-11-16 DIAGNOSIS — I2510 Atherosclerotic heart disease of native coronary artery without angina pectoris: Secondary | ICD-10-CM | POA: Diagnosis not present

## 2022-11-16 DIAGNOSIS — I10 Essential (primary) hypertension: Secondary | ICD-10-CM | POA: Diagnosis not present

## 2022-11-16 DIAGNOSIS — I340 Nonrheumatic mitral (valve) insufficiency: Secondary | ICD-10-CM | POA: Diagnosis not present

## 2022-11-16 DIAGNOSIS — E7849 Other hyperlipidemia: Secondary | ICD-10-CM | POA: Diagnosis not present

## 2022-11-16 DIAGNOSIS — I351 Nonrheumatic aortic (valve) insufficiency: Secondary | ICD-10-CM | POA: Diagnosis not present

## 2022-11-26 DIAGNOSIS — R7303 Prediabetes: Secondary | ICD-10-CM | POA: Diagnosis not present

## 2022-11-26 DIAGNOSIS — M79661 Pain in right lower leg: Secondary | ICD-10-CM | POA: Diagnosis not present

## 2022-11-26 DIAGNOSIS — Z0001 Encounter for general adult medical examination with abnormal findings: Secondary | ICD-10-CM | POA: Diagnosis not present

## 2022-11-26 DIAGNOSIS — M79662 Pain in left lower leg: Secondary | ICD-10-CM | POA: Diagnosis not present

## 2022-11-26 DIAGNOSIS — I10 Essential (primary) hypertension: Secondary | ICD-10-CM | POA: Diagnosis not present

## 2022-11-26 DIAGNOSIS — E7849 Other hyperlipidemia: Secondary | ICD-10-CM | POA: Diagnosis not present

## 2022-12-22 DIAGNOSIS — R600 Localized edema: Secondary | ICD-10-CM | POA: Diagnosis not present

## 2022-12-22 DIAGNOSIS — M79661 Pain in right lower leg: Secondary | ICD-10-CM | POA: Diagnosis not present

## 2022-12-22 DIAGNOSIS — M79662 Pain in left lower leg: Secondary | ICD-10-CM | POA: Diagnosis not present

## 2023-02-28 ENCOUNTER — Other Ambulatory Visit: Payer: Self-pay | Admitting: Cardiovascular Disease

## 2023-02-28 DIAGNOSIS — E785 Hyperlipidemia, unspecified: Secondary | ICD-10-CM

## 2023-02-28 DIAGNOSIS — R609 Edema, unspecified: Secondary | ICD-10-CM

## 2023-03-01 ENCOUNTER — Other Ambulatory Visit: Payer: Self-pay | Admitting: Nurse Practitioner

## 2023-03-01 DIAGNOSIS — I10 Essential (primary) hypertension: Secondary | ICD-10-CM

## 2023-03-07 ENCOUNTER — Ambulatory Visit (INDEPENDENT_AMBULATORY_CARE_PROVIDER_SITE_OTHER): Payer: Medicare HMO

## 2023-03-07 DIAGNOSIS — R238 Other skin changes: Secondary | ICD-10-CM | POA: Diagnosis not present

## 2023-03-07 DIAGNOSIS — R600 Localized edema: Secondary | ICD-10-CM | POA: Diagnosis not present

## 2023-03-10 ENCOUNTER — Other Ambulatory Visit: Payer: Medicare HMO

## 2023-03-10 DIAGNOSIS — R7303 Prediabetes: Secondary | ICD-10-CM | POA: Diagnosis not present

## 2023-03-10 DIAGNOSIS — E663 Overweight: Secondary | ICD-10-CM

## 2023-03-10 DIAGNOSIS — E7849 Other hyperlipidemia: Secondary | ICD-10-CM

## 2023-03-10 DIAGNOSIS — I10 Essential (primary) hypertension: Secondary | ICD-10-CM

## 2023-03-11 LAB — CBC WITH DIFFERENTIAL
Basophils Absolute: 0 10*3/uL (ref 0.0–0.2)
Basos: 0 %
EOS (ABSOLUTE): 0.2 10*3/uL (ref 0.0–0.4)
Eos: 3 %
Hematocrit: 42.7 % (ref 34.0–46.6)
Hemoglobin: 14.4 g/dL (ref 11.1–15.9)
Immature Grans (Abs): 0 10*3/uL (ref 0.0–0.1)
Immature Granulocytes: 0 %
Lymphocytes Absolute: 2.7 10*3/uL (ref 0.7–3.1)
Lymphs: 41 %
MCH: 30.9 pg (ref 26.6–33.0)
MCHC: 33.7 g/dL (ref 31.5–35.7)
MCV: 92 fL (ref 79–97)
Monocytes Absolute: 0.7 10*3/uL (ref 0.1–0.9)
Monocytes: 10 %
Neutrophils Absolute: 3 10*3/uL (ref 1.4–7.0)
Neutrophils: 46 %
RBC: 4.66 x10E6/uL (ref 3.77–5.28)
RDW: 13.1 % (ref 11.7–15.4)
WBC: 6.6 10*3/uL (ref 3.4–10.8)

## 2023-03-11 LAB — HEMOGLOBIN A1C
Est. average glucose Bld gHb Est-mCnc: 126 mg/dL
Hgb A1c MFr Bld: 6 % — ABNORMAL HIGH (ref 4.8–5.6)

## 2023-03-11 LAB — CMP14+EGFR
ALT: 26 IU/L (ref 0–32)
AST: 22 IU/L (ref 0–40)
Albumin/Globulin Ratio: 1.5 (ref 1.2–2.2)
Albumin: 4.3 g/dL (ref 3.8–4.8)
Alkaline Phosphatase: 51 IU/L (ref 44–121)
BUN/Creatinine Ratio: 21 (ref 12–28)
BUN: 18 mg/dL (ref 8–27)
Bilirubin Total: 0.3 mg/dL (ref 0.0–1.2)
CO2: 24 mmol/L (ref 20–29)
Calcium: 9.8 mg/dL (ref 8.7–10.3)
Chloride: 103 mmol/L (ref 96–106)
Creatinine, Ser: 0.84 mg/dL (ref 0.57–1.00)
Globulin, Total: 2.9 g/dL (ref 1.5–4.5)
Glucose: 97 mg/dL (ref 70–99)
Potassium: 4.2 mmol/L (ref 3.5–5.2)
Sodium: 139 mmol/L (ref 134–144)
Total Protein: 7.2 g/dL (ref 6.0–8.5)
eGFR: 72 mL/min/{1.73_m2} (ref >59)

## 2023-03-11 LAB — LIPID PANEL
Chol/HDL Ratio: 2.4 ratio (ref 0.0–4.4)
Cholesterol, Total: 125 mg/dL (ref 100–199)
HDL: 53 mg/dL (ref >39)
LDL Chol Calc (NIH): 60 mg/dL (ref 0–99)
Triglycerides: 54 mg/dL (ref 0–149)
VLDL Cholesterol Cal: 12 mg/dL (ref 5–40)

## 2023-03-11 LAB — TSH: TSH: 3.06 u[IU]/mL (ref 0.450–4.500)

## 2023-03-15 ENCOUNTER — Ambulatory Visit (INDEPENDENT_AMBULATORY_CARE_PROVIDER_SITE_OTHER): Payer: Medicare HMO | Admitting: Nurse Practitioner

## 2023-03-15 ENCOUNTER — Encounter: Payer: Self-pay | Admitting: Nurse Practitioner

## 2023-03-15 VITALS — BP 136/76 | HR 75 | Ht 65.0 in | Wt 195.6 lb

## 2023-03-15 DIAGNOSIS — Z1231 Encounter for screening mammogram for malignant neoplasm of breast: Secondary | ICD-10-CM

## 2023-03-15 DIAGNOSIS — R7303 Prediabetes: Secondary | ICD-10-CM | POA: Diagnosis not present

## 2023-03-15 DIAGNOSIS — I10 Essential (primary) hypertension: Secondary | ICD-10-CM | POA: Insufficient documentation

## 2023-03-15 DIAGNOSIS — E782 Mixed hyperlipidemia: Secondary | ICD-10-CM | POA: Diagnosis not present

## 2023-03-15 DIAGNOSIS — G4700 Insomnia, unspecified: Secondary | ICD-10-CM

## 2023-03-15 NOTE — Progress Notes (Signed)
Established Patient Office Visit  Subjective:  Patient ID: Cristina Roberts, female    DOB: 1946-08-10  Age: 77 y.o. MRN: [id number]  Chief Complaint  Patient presents with   Follow-up    3 month follow up, review lab results.    3 month follow up and lab results review, only A1c is abnormal at 6.0%, LDL is at goal.  Reports not sleeping.      No other concerns at this time.   Past Medical History:  Diagnosis Date   Arthritis    Osteoarthritis and Rheumatoid    Cancer ([place])    Lung    Chronic kidney disease    Heart disease    Hyperlipidemia    Hypertension     Past Surgical History:  Procedure Laterality Date   COLONOSCOPY WITH PROPOFOL N/A 02/07/2018   Procedure: COLONOSCOPY WITH PROPOFOL;  Surgeon: Lollie Sails, MD;  Location: [facility] ENDOSCOPY;  Service: Endoscopy;  Laterality: N/A;   fatty tumor removed     TOTAL HIP ARTHROPLASTY Right 09/10/2015   Procedure: TOTAL HIP ARTHROPLASTY;  Surgeon: Earnestine Leys, MD;  Location: [facility] ORS;  Service: Orthopedics;  Laterality: Right;   TUBAL LIGATION      Social History   Socioeconomic History   Marital status: Divorced    Spouse name: Not on file   Number of children: Not on file   Years of education: Not on file   Highest education level: Not on file  Occupational History   Not on file  Tobacco Use   Smoking status: Never   Smokeless tobacco: Never  Vaping Use   Vaping Use: Never used  Substance and Sexual Activity   Alcohol use: No    Alcohol/week: 0.0 standard drinks of alcohol   Drug use: No   Sexual activity: Not on file  Other Topics Concern   Not on file  Social History Narrative   Not on file   Social Determinants of Health   Financial Resource Strain: Not on file  Food Insecurity: Not on file  Transportation Needs: Not on file  Physical Activity: Not on file  Stress: Not on file  Social Connections: Not on file  Intimate Partner Violence: Not on file    Family History   Problem Relation Age of Onset   Breast cancer Neg Hx     No Known Allergies  Review of Systems  Constitutional: Negative.   HENT: Negative.    Eyes: Negative.   Respiratory: Negative.    Cardiovascular: Negative.   Gastrointestinal: Negative.   Genitourinary: Negative.   Musculoskeletal: Negative.   Skin: Negative.   Neurological: Negative.   Endo/Heme/Allergies: Negative.   Psychiatric/Behavioral:  The patient has insomnia.        Objective:   BP 136/76   Pulse 75   Ht 5\' 5"  (1.651 m)   Wt 195 lb 9.6 oz (88.7 kg)   SpO2 95%   BMI 32.55 kg/m   Vitals:   03/15/23 1029  BP: 136/76  Pulse: 75  Height: 5\' 5"  (1.651 m)  Weight: 195 lb 9.6 oz (88.7 kg)  SpO2: 95%  BMI (Calculated): 32.55    Physical Exam Vitals reviewed.  Constitutional:      Appearance: Normal appearance.  HENT:     Head: Normocephalic.     Nose: Nose normal.     Mouth/Throat:     Mouth: Mucous membranes are moist.  Eyes:     Pupils: Pupils are equal, round, and reactive to light.  Cardiovascular:     Rate and Rhythm: Normal rate and regular rhythm.  Pulmonary:     Effort: Pulmonary effort is normal.     Breath sounds: Normal breath sounds.  Abdominal:     General: Bowel sounds are normal.     Palpations: Abdomen is soft.  Musculoskeletal:        General: Normal range of motion.     Cervical back: Normal range of motion and neck supple.  Skin:    General: Skin is warm and dry.  Neurological:     Mental Status: She is alert and oriented to person, place, and time.  Psychiatric:        Mood and Affect: Mood normal.        Behavior: Behavior normal.      No results found for any visits on 03/15/23.  Recent Results (from the past 2160 hour(s))  Hemoglobin A1c     Status: Abnormal   Collection Time: 03/10/23 11:16 AM  Result Value Ref Range   Hgb A1c MFr Bld 6.0 (H) 4.8 - 5.6 %    Comment:          Prediabetes: 5.7 - 6.4          Diabetes: >6.4          Glycemic control for  adults with diabetes: <7.0    Est. average glucose Bld gHb Est-mCnc 126 mg/dL  TSH     Status: None   Collection Time: 03/10/23 11:16 AM  Result Value Ref Range   TSH 3.060 0.450 - 4.500 uIU/mL  CMP14+EGFR     Status: None   Collection Time: 03/10/23 11:16 AM  Result Value Ref Range   Glucose 97 70 - 99 mg/dL   BUN 18 8 - 27 mg/dL   Creatinine, Ser 0.84 0.57 - 1.00 mg/dL   eGFR 72 >59 mL/min/1.73   BUN/Creatinine Ratio 21 12 - 28   Sodium 139 134 - 144 mmol/L   Potassium 4.2 3.5 - 5.2 mmol/L   Chloride 103 96 - 106 mmol/L   CO2 24 20 - 29 mmol/L   Calcium 9.8 8.7 - 10.3 mg/dL   Total Protein 7.2 6.0 - 8.5 g/dL   Albumin 4.3 3.8 - 4.8 g/dL   Globulin, Total 2.9 1.5 - 4.5 g/dL   Albumin/Globulin Ratio 1.5 1.2 - 2.2   Bilirubin Total 0.3 0.0 - 1.2 mg/dL   Alkaline Phosphatase 51 44 - 121 IU/L   AST 22 0 - 40 IU/L   ALT 26 0 - 32 IU/L  Lipid panel     Status: None   Collection Time: 03/10/23 11:16 AM  Result Value Ref Range   Cholesterol, Total 125 100 - 199 mg/dL   Triglycerides 54 0 - 149 mg/dL   HDL 53 >39 mg/dL   VLDL Cholesterol Cal 12 5 - 40 mg/dL   LDL Chol Calc (NIH) 60 0 - 99 mg/dL   Chol/HDL Ratio 2.4 0.0 - 4.4 ratio    Comment:                                   T. Chol/HDL Ratio                                             Men  Women  1/2 Avg.Risk  3.4    3.3                                   Avg.Risk  5.0    4.4                                2X Avg.Risk  9.6    7.1                                3X Avg.Risk 23.4   11.0   CBC With Differential     Status: None   Collection Time: 03/10/23 11:16 AM  Result Value Ref Range   WBC 6.6 3.4 - 10.8 x10E3/uL   RBC 4.66 3.77 - 5.28 x10E6/uL   Hemoglobin 14.4 11.1 - 15.9 g/dL   Hematocrit 42.7 34.0 - 46.6 %   MCV 92 79 - 97 fL   MCH 30.9 26.6 - 33.0 pg   MCHC 33.7 31.5 - 35.7 g/dL   RDW 13.1 11.7 - 15.4 %   Neutrophils 46 Not Estab. %   Lymphs 41 Not Estab. %   Monocytes 10 Not Estab.  %   Eos 3 Not Estab. %   Basos 0 Not Estab. %   Neutrophils Absolute 3.0 1.4 - 7.0 x10E3/uL   Lymphocytes Absolute 2.7 0.7 - 3.1 x10E3/uL   Monocytes Absolute 0.7 0.1 - 0.9 x10E3/uL   EOS (ABSOLUTE) 0.2 0.0 - 0.4 x10E3/uL   Basophils Absolute 0.0 0.0 - 0.2 x10E3/uL   Immature Granulocytes 0 Not Estab. %   Immature Grans (Abs) 0.0 0.0 - 0.1 x10E3/uL      Assessment & Plan:   Problem List Items Addressed This Visit       Cardiovascular and Mediastinum   Essential hypertension, benign - Primary   Relevant Medications   lisinopril-hydrochlorothiazide (ZESTORETIC) 20-25 MG tablet     Other   Mixed hyperlipidemia   Relevant Medications   lisinopril-hydrochlorothiazide (ZESTORETIC) 20-25 MG tablet   Prediabetes   Insomnia    No follow-ups on file.   Total time spent: 35 minutes  Evern Bio, NP  03/15/2023

## 2023-03-15 NOTE — Patient Instructions (Signed)
1) Add lotion right after shower for dry skin 2) Cont Sleepytime tea nightly at bedtime, if it stops working, message provider 3) Follow up appt in 3 montsh, 2 weeks, fasting labs prior 4) Watch sugar

## 2023-03-17 ENCOUNTER — Ambulatory Visit: Payer: Medicare HMO | Admitting: Cardiovascular Disease

## 2023-03-24 ENCOUNTER — Encounter: Payer: Self-pay | Admitting: Cardiovascular Disease

## 2023-03-24 ENCOUNTER — Ambulatory Visit (INDEPENDENT_AMBULATORY_CARE_PROVIDER_SITE_OTHER): Payer: Medicare HMO | Admitting: Cardiovascular Disease

## 2023-03-24 VITALS — BP 130/70 | HR 76 | Ht 64.0 in | Wt 196.4 lb

## 2023-03-24 DIAGNOSIS — I5189 Other ill-defined heart diseases: Secondary | ICD-10-CM | POA: Diagnosis not present

## 2023-03-24 DIAGNOSIS — E782 Mixed hyperlipidemia: Secondary | ICD-10-CM | POA: Diagnosis not present

## 2023-03-24 DIAGNOSIS — M1610 Unilateral primary osteoarthritis, unspecified hip: Secondary | ICD-10-CM | POA: Insufficient documentation

## 2023-03-24 DIAGNOSIS — I10 Essential (primary) hypertension: Secondary | ICD-10-CM | POA: Diagnosis not present

## 2023-03-24 DIAGNOSIS — I38 Endocarditis, valve unspecified: Secondary | ICD-10-CM

## 2023-03-24 DIAGNOSIS — M47819 Spondylosis without myelopathy or radiculopathy, site unspecified: Secondary | ICD-10-CM | POA: Insufficient documentation

## 2023-03-24 DIAGNOSIS — M069 Rheumatoid arthritis, unspecified: Secondary | ICD-10-CM | POA: Diagnosis not present

## 2023-03-24 NOTE — Assessment & Plan Note (Signed)
Well controlled on rosuvastatin 20 mg daily. LDL 60 March 2024.

## 2023-03-24 NOTE — Progress Notes (Signed)
Cardiology Office Note   Date:  03/24/2023   ID:  Cristina, Roberts [birth year], MRN [id number]  PCP:  Evern Bio, NP  Cardiologist:  Neoma Laming, MD      History of Present Illness: Cristina Roberts is a 77 y.o. female who presents for No chief complaint on file.   Patient in office for routine cardiac exam. Denies chest pain, shortness of breath, edema, palpitations.    Past Medical History:  Diagnosis Date   Arthritis    Osteoarthritis and Rheumatoid    Cancer    Lung    Chronic kidney disease    Heart disease    Hyperlipidemia    Hypertension      Past Surgical History:  Procedure Laterality Date   COLONOSCOPY WITH PROPOFOL N/A 02/07/2018   Procedure: COLONOSCOPY WITH PROPOFOL;  Surgeon: Lollie Sails, MD;  Location: [facility] ENDOSCOPY;  Service: Endoscopy;  Laterality: N/A;   fatty tumor removed     TOTAL HIP ARTHROPLASTY Right 09/10/2015   Procedure: TOTAL HIP ARTHROPLASTY;  Surgeon: Earnestine Leys, MD;  Location: [facility] ORS;  Service: Orthopedics;  Laterality: Right;   TUBAL LIGATION       Current Outpatient Medications  Medication Sig Dispense Refill   acetaminophen (TYLENOL) 500 MG tablet Take 2 tablets (1,000 mg total) by mouth every 6 (six) hours as needed for moderate pain. 30 tablet 0   amLODipine (NORVASC) 5 MG tablet TAKE 1 TABLET BY MOUTH EVERY DAY 90 tablet 3   Calcium Carbonate-Vitamin D (CALCIUM 600+D) 600-400 MG-UNIT per tablet Take 1 tablet by mouth daily.     cholecalciferol (VITAMIN D) 400 units TABS tablet Take 400 Units by mouth.     lisinopril-hydrochlorothiazide (ZESTORETIC) 20-25 MG tablet Take 1 tablet by mouth daily.     rosuvastatin (CRESTOR) 20 MG tablet TAKE 1 TABLET BY MOUTH EVERY DAY 90 tablet 1   No current facility-administered medications for this visit.    Allergies:   Patient has no known allergies.    Social History:   reports that she has never smoked. She has never used smokeless tobacco. She  reports that she does not drink alcohol and does not use drugs.   Family History:  family history is not on file.    ROS:     Review of Systems  Constitutional: Negative.   HENT: Negative.    Eyes: Negative.   Respiratory: Negative.    Cardiovascular: Negative.   Gastrointestinal: Negative.   Genitourinary: Negative.   Musculoskeletal: Negative.   Skin: Negative.   Neurological: Negative.   Endo/Heme/Allergies: Negative.   Psychiatric/Behavioral: Negative.    All other systems reviewed and are negative.   All other systems are reviewed and negative.   PHYSICAL EXAM: VS:  BP 130/70   Pulse 76   Ht 5\' 4"  (1.626 m)   Wt 196 lb 6.4 oz (89.1 kg)   SpO2 96%   BMI 33.71 kg/m  , BMI Body mass index is 33.71 kg/m. Last weight:  Wt Readings from Last 3 Encounters:  03/24/23 196 lb 6.4 oz (89.1 kg)  03/15/23 195 lb 9.6 oz (88.7 kg)  02/07/18 206 lb (93.4 kg)    Physical Exam Constitutional:      Appearance: Normal appearance.  Cardiovascular:     Rate and Rhythm: Normal rate and regular rhythm.     Heart sounds: Normal heart sounds.  Pulmonary:     Effort: Pulmonary effort is normal.     Breath sounds: Normal  breath sounds.  Musculoskeletal:     Right lower leg: No edema.     Left lower leg: No edema.  Neurological:     Mental Status: She is alert.     EKG: none today  Recent Labs: 03/10/2023: ALT 26; BUN 18; Creatinine, Ser 0.84; Hemoglobin 14.4; Potassium 4.2; Sodium 139; TSH 3.060    Lipid Panel    Component Value Date/Time   CHOL 125 03/10/2023 1116   TRIG 54 03/10/2023 1116   HDL 53 03/10/2023 1116   CHOLHDL 2.4 03/10/2023 1116   LDLCALC 60 03/10/2023 1116      Other studies Reviewed: Patient: 1283.0 - [name] DOB:  November 17, 1946  Date:  01/05/2022 10:30 Provider: Neoma Laming MD Encounter: ECHO   Page 2 REASON FOR VISIT  Visit for: Echocardiogram/I 25.118  Sex:   Female  wt= 201   lbs.  BP=128/70  Height= 64   inches    TESTS  Imaging: Echocardiogram:  An echocardiogram in (2-d) mode was performed and in Doppler mode with color flow velocity mapping was performed. The aortic valve cusps are abnormal 1.9   cm, flow velocity 1.13   m/s, and systolic calculated mean flow gradient 3   mmHg. Mitral valve diastolic peak flow velocity E .582   m/s and E/A ratio 0.6. Aortic root diameter 3.4   cm. The LVOT internal diameter 2.9  cm and flow velocity was abnormal 1.06   m/s. LV systolic dimension 123XX123    cm, diastolic 0000000  cm, posterior wall thickness 0.975    cm, fractional shortening 46 %, and EF 78.7 %. IVS thickness 1.73   cm. LA dimension 3.6 cm. Mitral Valve has Trace Regurgitation. Aortic Valve has Trace Regurgitation. Tricuspid Valve has Trace Regurgitation.   ASSESSMENT  Suboptimal study due to poor windows.  Normal chamber sizes.  Normal left ventricular systolic function.  Mild left ventricular hypertrophy with GRADE 3 (restrictive physiology) diastolic dysfunction.  Normal right ventricular systolic function.  Normal right ventricular diastolic function.  Normal left ventricular wall motion.  Normal right ventricular wall motion.  Trace tricuspid regurgitation.  Normal pulmonary artery pressure.  Trace mitral regurgitation.  Trace aortic regurgitation.  No pericardial effusion.  Mildly dilated Left atrium  Mild LVH.   THERAPY   Referring physician: Dionisio David  Sonographer: Neoma Laming.   Neoma Laming MD  Electronically signed by: Neoma Laming     Date: 01/07/2022 10:21  Patient: 1283.0 - Cristina Roberts DOB:  03/11/1946  Date:  01/01/2022 07:30 Provider: Neoma Laming MD Encounter: NUCLEAR STRESS TEST   Page 1 TESTS                                                                                          [facility] [address] [place], [place] [address] [phone] STUDY:  Gated Stress / Rest Myocardial Perfusion Imaging Tomographic  (SPECT) Including attenuation correction Wall Motion, Left Ventricular Ejection Fraction By Gated Technique.Treadmill Stress Test. SEX: Female  WEIGHT: 200 lbs  HEIGHT: 64 in     ARMS UP: YES/NO  REFERRING PHYSICIAN: Dr.Ashelyn Mccravy Humphrey Rolls                                                                                                                                                                                                                       INDICATION FOR STUDY: I 25.118                                                                                                                                                                                                                   TECHNIQUE:  Approximately 20 minutes following the intravenous administration of 10.0 mCi of Tc-4m Sestamibi after stress testing in a reclined supine position with arms above their head if able to do so, gated SPECT imaging of the heart was performed. After about a 2hr break, the patient was injected intravenously with 30.9 mCi of Tc-72m Sestamibi.  Approximately 45 minutes later in the same position as stress imaging SPECT rest imaging of the heart was performed.  STRESS BY:  Neoma Laming, MD PROTOCOL:  Darnell Level  MAX PRED HR: 145                     85%: 123               75%: 109                                                                                                                   RESTING BP: 118/62  RESTING HR: 90  PEAK BP: 142/72   PEAK HR: 133 (91%)                                                                    EXERCISE DURATION: 3:00                                             METS: 4.7     REASON FOR TEST TERMINATION: Target reached/1 min post injection                                                                                                                                   SYMPTOMS: Fatigue  [facility] TREADMILL SCORE: 3                                       RISK: Moderate  EKG RESULTS: NSR. 90/min. No significant ST changes at peak exercise.                                                              IMAGE QUALITY: Fair                                                                                                                                                                                                                                                                                                                                  PERFUSION/WALL MOTION FINDINGS: EF = 94%. No perfusion defects, normal wall motion.                                                                           IMPRESSION: Normal stress test with normal LVEF.  Neoma Laming, MD Stress Interpreting Physician / Nuclear Interpreting Physician  Neoma Laming MD  Electronically signed by: Neoma Laming     Date: 01/04/2022 10:57  Patient: 1283.0 - Cristina Roberts DOB:  [birth year]  Date:  03/14/2018 10:00 Provider: Neoma Laming MD Encounter: ALL ANGIOGRAMS (CTA BRAIN, CAROTIDS, RENAL ARTERIES, PE)   Page 2 REASON FOR VISIT  Referred by Jake Bathe.   TESTS  Imaging: Computed Tomographic Angiography:  Cardiac multidetector CT was performed paying particular attention to the coronary arteries for the diagnosis of: Chest Pain. Diagnostic Drugs:  Administered iohexol (Omnipaque)  through an antecubital vein and images from the examination were analyzed for the presence and extent of coronary artery disease, using 3D image processing software. 100 mL of non-ionic contrast (Omnipaque) was used.   ASSESSMENT   The proximal LAD artery are abnormal:1     TEST CONCLUSIONS  Quality of study: Excellent  1-Calcium score 66.8  2-Right dominant system  3-Mild proximal LAD and left main calcification without stenosis, treat medically.  Neoma Laming MD  Electronically signed by: Neoma Laming     Date: 03/16/2018 09:22   ASSESSMENT AND PLAN:    ICD-10-CM   1. Essential hypertension, benign  I10     2. Mixed hyperlipidemia  E78.2     3. Diastolic dysfunction  123XX123     4. Heart valve problem  I38        Problem List Items Addressed This Visit       Cardiovascular and Mediastinum   Essential hypertension, benign - Primary    Blood pressure well controlled. Continue current plan. EF 78% on 12/2021 echo.      Heart valve problem    Trace AVR, MVR, and TVR on 12/2021 echo.         Other   Mixed hyperlipidemia    Well controlled on rosuvastatin 20 mg daily. LDL 60 March 2024.      Diastolic dysfunction    Gade III on 12/2021 echo. Denies shortness of breath, LE edema.          Disposition:   Return in about 6 months (around 09/23/2023).    Total time spent: 30 minutes  Signed,  Neoma Laming, MD  03/24/2023 10:23 AM    [facility]

## 2023-03-24 NOTE — Assessment & Plan Note (Signed)
Trace AVR, MVR, and TVR on 12/2021 echo.

## 2023-03-24 NOTE — Assessment & Plan Note (Signed)
Gade III on 12/2021 echo. Denies shortness of breath, LE edema.

## 2023-03-24 NOTE — Assessment & Plan Note (Signed)
Blood pressure well controlled. Continue current plan. EF 78% on 12/2021 echo.

## 2023-04-21 ENCOUNTER — Other Ambulatory Visit: Payer: Self-pay | Admitting: Nurse Practitioner

## 2023-05-30 ENCOUNTER — Ambulatory Visit
Admission: RE | Admit: 2023-05-30 | Discharge: 2023-05-30 | Disposition: A | Discharge status: Home / Self Care | Payer: Medicare HMO | Source: Ambulatory Visit | Attending: Nurse Practitioner | Admitting: Nurse Practitioner

## 2023-05-30 DIAGNOSIS — Z1231 Encounter for screening mammogram for malignant neoplasm of breast: Secondary | ICD-10-CM | POA: Insufficient documentation

## 2023-05-31 ENCOUNTER — Other Ambulatory Visit: Payer: Self-pay | Admitting: Cardiovascular Disease

## 2023-05-31 ENCOUNTER — Other Ambulatory Visit: Payer: Self-pay | Admitting: Nurse Practitioner

## 2023-05-31 DIAGNOSIS — R609 Edema, unspecified: Secondary | ICD-10-CM

## 2023-05-31 DIAGNOSIS — I10 Essential (primary) hypertension: Secondary | ICD-10-CM

## 2023-06-15 ENCOUNTER — Ambulatory Visit: Payer: Medicare HMO | Admitting: Nurse Practitioner

## 2023-08-31 ENCOUNTER — Other Ambulatory Visit: Payer: Self-pay | Admitting: Cardiovascular Disease

## 2023-08-31 DIAGNOSIS — E785 Hyperlipidemia, unspecified: Secondary | ICD-10-CM

## 2023-08-31 DIAGNOSIS — R609 Edema, unspecified: Secondary | ICD-10-CM

## 2023-09-16 ENCOUNTER — Other Ambulatory Visit: Payer: Medicare HMO

## 2023-09-16 ENCOUNTER — Other Ambulatory Visit: Payer: Self-pay | Admitting: Family

## 2023-09-16 DIAGNOSIS — E559 Vitamin D deficiency, unspecified: Secondary | ICD-10-CM | POA: Diagnosis not present

## 2023-09-16 DIAGNOSIS — E782 Mixed hyperlipidemia: Secondary | ICD-10-CM

## 2023-09-16 DIAGNOSIS — R7303 Prediabetes: Secondary | ICD-10-CM | POA: Diagnosis not present

## 2023-09-16 DIAGNOSIS — R5383 Other fatigue: Secondary | ICD-10-CM

## 2023-09-16 DIAGNOSIS — I10 Essential (primary) hypertension: Secondary | ICD-10-CM

## 2023-09-17 LAB — CMP14+EGFR
ALT: 26 [IU]/L (ref 0–32)
AST: 22 [IU]/L (ref 0–40)
Albumin: 4.1 g/dL (ref 3.8–4.8)
Alkaline Phosphatase: 49 [IU]/L (ref 44–121)
BUN/Creatinine Ratio: 19 (ref 12–28)
BUN: 14 mg/dL (ref 8–27)
Bilirubin Total: 0.2 mg/dL (ref 0.0–1.2)
CO2: 22 mmol/L (ref 20–29)
Calcium: 9.6 mg/dL (ref 8.7–10.3)
Chloride: 103 mmol/L (ref 96–106)
Creatinine, Ser: 0.74 mg/dL (ref 0.57–1.00)
Globulin, Total: 2.6 g/dL (ref 1.5–4.5)
Glucose: 102 mg/dL — ABNORMAL HIGH (ref 70–99)
Potassium: 4.1 mmol/L (ref 3.5–5.2)
Sodium: 141 mmol/L (ref 134–144)
Total Protein: 6.7 g/dL (ref 6.0–8.5)
eGFR: 83 mL/min/{1.73_m2} (ref >59)

## 2023-09-17 LAB — LIPID PANEL
Chol/HDL Ratio: 2.4 {ratio} (ref 0.0–4.4)
Cholesterol, Total: 128 mg/dL (ref 100–199)
HDL: 54 mg/dL (ref >39)
LDL Chol Calc (NIH): 61 mg/dL (ref 0–99)
Triglycerides: 59 mg/dL (ref 0–149)
VLDL Cholesterol Cal: 13 mg/dL (ref 5–40)

## 2023-09-17 LAB — VITAMIN D 25 HYDROXY (VIT D DEFICIENCY, FRACTURES): Vit D, 25-Hydroxy: 95.9 ng/mL (ref 30.0–100.0)

## 2023-09-17 LAB — TSH: TSH: 2.62 u[IU]/mL (ref 0.450–4.500)

## 2023-09-22 ENCOUNTER — Encounter: Payer: Self-pay | Admitting: Cardiology

## 2023-09-22 ENCOUNTER — Ambulatory Visit (INDEPENDENT_AMBULATORY_CARE_PROVIDER_SITE_OTHER): Payer: Medicare HMO | Admitting: Cardiology

## 2023-09-22 VITALS — BP 132/76 | HR 76 | Ht 65.0 in | Wt 201.0 lb

## 2023-09-22 DIAGNOSIS — R7303 Prediabetes: Secondary | ICD-10-CM | POA: Diagnosis not present

## 2023-09-22 DIAGNOSIS — Z1329 Encounter for screening for other suspected endocrine disorder: Secondary | ICD-10-CM

## 2023-09-22 DIAGNOSIS — I10 Essential (primary) hypertension: Secondary | ICD-10-CM

## 2023-09-22 DIAGNOSIS — E782 Mixed hyperlipidemia: Secondary | ICD-10-CM

## 2023-09-22 NOTE — Progress Notes (Signed)
Established Patient Office Visit  Subjective:  Patient ID: Cristina Roberts, female    DOB: 1946-10-24  Age: 77 y.o. MRN: [id number]  Chief Complaint  Patient presents with   Follow-up    Follow up    Patient in office for regular follow up, discuss recent lab results. Patient reports feeling well. No complaints today. Sleeping better using sleepy time tea prior to bedtime.  Discussed recent lab results. Cholesterol well controlled. Hgb A1c not done. Continue current medications.     No other concerns at this time.   Past Medical History:  Diagnosis Date   Arthritis    Osteoarthritis and Rheumatoid    Cancer (HCC)    Lung    Chronic kidney disease    Heart disease    Hyperlipidemia    Hypertension     Past Surgical History:  Procedure Laterality Date   COLONOSCOPY WITH PROPOFOL N/A 02/07/2018   Procedure: COLONOSCOPY WITH PROPOFOL;  Surgeon: Christena Deem, MD;  Location: [facility] ENDOSCOPY;  Service: Endoscopy;  Laterality: N/A;   fatty tumor removed     TOTAL HIP ARTHROPLASTY Right 09/10/2015   Procedure: TOTAL HIP ARTHROPLASTY;  Surgeon: Deeann Saint, MD;  Location: [facility] ORS;  Service: Orthopedics;  Laterality: Right;   TUBAL LIGATION      Social History   Socioeconomic History   Marital status: Divorced    Spouse name: Not on file   Number of children: Not on file   Years of education: Not on file   Highest education level: Not on file  Occupational History   Not on file  Tobacco Use   Smoking status: Never   Smokeless tobacco: Never  Vaping Use   Vaping status: Never Used  Substance and Sexual Activity   Alcohol use: No    Alcohol/week: 0.0 standard drinks of alcohol   Drug use: No   Sexual activity: Not on file  Other Topics Concern   Not on file  Social History Narrative   Not on file   Social Determinants of Health   Financial Resource Strain: Not on file  Food Insecurity: No Food Insecurity (02/14/2020)   Received from [facility]   Hunger Vital Sign    Worried About Running Out of Food in the Last Year: Never true    Ran Out of Food in the Last Year: Never true  Transportation Needs: Not on file  Physical Activity: Not on file  Stress: Not on file  Social Connections: Not on file  Intimate Partner Violence: Not on file    Family History  Problem Relation Age of Onset   Breast cancer Neg Hx     No Known Allergies  Review of Systems  Constitutional: Negative.   HENT: Negative.    Eyes: Negative.   Respiratory: Negative.  Negative for shortness of breath.   Cardiovascular: Negative.  Negative for chest pain.  Gastrointestinal: Negative.  Negative for abdominal pain, constipation and diarrhea.  Genitourinary: Negative.   Musculoskeletal:  Negative for joint pain and myalgias.  Skin: Negative.   Neurological: Negative.  Negative for dizziness and headaches.  Endo/Heme/Allergies: Negative.   All other systems reviewed and are negative.      Objective:   BP 132/76   Pulse 76   Ht 5\' 5"  (1.651 m)   Wt 201 lb (91.2 kg)   SpO2 96%   BMI 33.45 kg/m   Vitals:   09/22/23 0940  BP: 132/76  Pulse: 76  Height: 5\' 5"  (1.651 m)  Weight: 201 lb (91.2 kg)  SpO2: 96%  BMI (Calculated): 33.45    Physical Exam Vitals and nursing note reviewed.  Constitutional:      Appearance: Normal appearance. She is normal weight.  HENT:     Head: Normocephalic and atraumatic.     Nose: Nose normal.     Mouth/Throat:     Mouth: Mucous membranes are moist.  Eyes:     Extraocular Movements: Extraocular movements intact.     Conjunctiva/sclera: Conjunctivae normal.     Pupils: Pupils are equal, round, and reactive to light.  Cardiovascular:     Rate and Rhythm: Normal rate and regular rhythm.     Pulses: Normal pulses.     Heart sounds: Normal heart sounds.  Pulmonary:     Effort: Pulmonary effort is normal.     Breath sounds: Normal breath sounds.  Abdominal:     General: Abdomen is  flat. Bowel sounds are normal.     Palpations: Abdomen is soft.  Musculoskeletal:        General: Normal range of motion.     Cervical back: Normal range of motion.  Skin:    General: Skin is warm and dry.  Neurological:     General: No focal deficit present.     Mental Status: She is alert and oriented to person, place, and time.  Psychiatric:        Mood and Affect: Mood normal.        Behavior: Behavior normal.        Thought Content: Thought content normal.        Judgment: Judgment normal.      No results found for any visits on 09/22/23.  Recent Results (from the past 2160 hour(s))  Lipid panel     Status: None   Collection Time: 09/16/23  9:50 AM  Result Value Ref Range   Cholesterol, Total 128 100 - 199 mg/dL   Triglycerides 59 0 - 149 mg/dL   HDL 54 >41 mg/dL   VLDL Cholesterol Cal 13 5 - 40 mg/dL   LDL Chol Calc (NIH) 61 0 - 99 mg/dL   Chol/HDL Ratio 2.4 0.0 - 4.4 ratio    Comment:                                   T. Chol/HDL Ratio                                             Men  Women                               1/2 Avg.Risk  3.4    3.3                                   Avg.Risk  5.0    4.4                                2X Avg.Risk  9.6    7.1  3X Avg.Risk 23.4   11.0   VITAMIN D 25 Hydroxy (Vit-D Deficiency, Fractures)     Status: None   Collection Time: 09/16/23  9:50 AM  Result Value Ref Range   Vit D, 25-Hydroxy 95.9 30.0 - 100.0 ng/mL    Comment: Vitamin D deficiency has been defined by the Institute of Medicine and an Endocrine Society practice guideline as a level of serum 25-OH vitamin D less than 20 ng/mL (1,2). The Endocrine Society went on to further define vitamin D insufficiency as a level between 21 and 29 ng/mL (2). 1. IOM (Institute of Medicine). 2010. Dietary reference    intakes for calcium and D. [place]: The    [employer]. 2. Holick MF, Binkley [place], Bischoff-Ferrari HA, et al.     Evaluation, treatment, and prevention of vitamin D    deficiency: an Endocrine Society clinical practice    guideline. JCEM. 2011 Jul; 96(7):1911-30.   CMP14+EGFR     Status: Abnormal   Collection Time: 09/16/23  9:50 AM  Result Value Ref Range   Glucose 102 (H) 70 - 99 mg/dL   BUN 14 8 - 27 mg/dL   Creatinine, Ser 1.61 0.57 - 1.00 mg/dL   eGFR 83 >09 UE/AVW/0.98   BUN/Creatinine Ratio 19 12 - 28   Sodium 141 134 - 144 mmol/L   Potassium 4.1 3.5 - 5.2 mmol/L   Chloride 103 96 - 106 mmol/L   CO2 22 20 - 29 mmol/L   Calcium 9.6 8.7 - 10.3 mg/dL   Total Protein 6.7 6.0 - 8.5 g/dL   Albumin 4.1 3.8 - 4.8 g/dL   Globulin, Total 2.6 1.5 - 4.5 g/dL   Bilirubin Total 0.2 0.0 - 1.2 mg/dL   Alkaline Phosphatase 49 44 - 121 IU/L   AST 22 0 - 40 IU/L   ALT 26 0 - 32 IU/L  TSH     Status: None   Collection Time: 09/16/23  9:50 AM  Result Value Ref Range   TSH 2.620 0.450 - 4.500 uIU/mL      Assessment & Plan:  Continue same medications.  Labs prior to visit in 3 months.   Problem List Items Addressed This Visit       Cardiovascular and Mediastinum   Essential hypertension, benign - Primary     Other   Mixed hyperlipidemia   Prediabetes    Return in about 4 months (around 01/23/2024) for with fasting labs prior.   Total time spent: 25 minutes  [employer], NP  09/22/2023   This document may have been prepared by Dragon Voice Recognition software and as such may include unintentional dictation errors.

## 2023-09-23 ENCOUNTER — Encounter: Payer: Self-pay | Admitting: Cardiovascular Disease

## 2023-09-23 ENCOUNTER — Ambulatory Visit: Payer: Medicare HMO | Admitting: Cardiovascular Disease

## 2023-09-23 VITALS — BP 135/80 | HR 77 | Ht 65.0 in | Wt 199.6 lb

## 2023-09-23 DIAGNOSIS — E782 Mixed hyperlipidemia: Secondary | ICD-10-CM | POA: Diagnosis not present

## 2023-09-23 DIAGNOSIS — I5189 Other ill-defined heart diseases: Secondary | ICD-10-CM

## 2023-09-23 DIAGNOSIS — I10 Essential (primary) hypertension: Secondary | ICD-10-CM | POA: Diagnosis not present

## 2023-09-23 DIAGNOSIS — R0602 Shortness of breath: Secondary | ICD-10-CM

## 2023-09-23 DIAGNOSIS — I5033 Acute on chronic diastolic (congestive) heart failure: Secondary | ICD-10-CM

## 2023-09-23 MED ORDER — FUROSEMIDE 20 MG PO TABS: 20.0000 mg | ORAL_TABLET | Freq: Every day | ORAL | 11 refills | Status: DC

## 2023-09-23 NOTE — Progress Notes (Signed)
Cardiology Office Note   Date:  09/23/2023   ID:  Nysia Dell, DOB 1946/03/24, MRN [id number]  PCP:  Marisue Ivan, NP  Cardiologist:  Adrian Blackwater, MD      History of Present Illness: Cristina Roberts is a 77 y.o. female who presents for  Chief Complaint  Patient presents with   Follow-up    6 mo f/u    Swelling of legs      Past Medical History:  Diagnosis Date   Arthritis    Osteoarthritis and Rheumatoid    Cancer (HCC)    Lung    Chronic kidney disease    Heart disease    Hyperlipidemia    Hypertension      Past Surgical History:  Procedure Laterality Date   COLONOSCOPY WITH PROPOFOL N/A 02/07/2018   Procedure: COLONOSCOPY WITH PROPOFOL;  Surgeon: Christena Deem, MD;  Location: [facility] ENDOSCOPY;  Service: Endoscopy;  Laterality: N/A;   fatty tumor removed     TOTAL HIP ARTHROPLASTY Right 09/10/2015   Procedure: TOTAL HIP ARTHROPLASTY;  Surgeon: Deeann Saint, MD;  Location: [facility] ORS;  Service: Orthopedics;  Laterality: Right;   TUBAL LIGATION       Current Outpatient Medications  Medication Sig Dispense Refill   furosemide (LASIX) 20 MG tablet Take 1 tablet (20 mg total) by mouth daily. 30 tablet 11   acetaminophen (TYLENOL) 500 MG tablet Take 2 tablets (1,000 mg total) by mouth every 6 (six) hours as needed for moderate pain. 30 tablet 0   amLODipine (NORVASC) 5 MG tablet TAKE 1 TABLET BY MOUTH EVERY DAY 90 tablet 3   Calcium Carbonate-Vitamin D (CALCIUM 600+D) 600-400 MG-UNIT per tablet Take 1 tablet by mouth daily.     cholecalciferol (VITAMIN D) 400 units TABS tablet Take 400 Units by mouth.     lisinopril-hydrochlorothiazide (ZESTORETIC) 20-25 MG tablet TAKE 1 TABLET BY MOUTH EVERY DAY 90 tablet 3   rosuvastatin (CRESTOR) 20 MG tablet TAKE 1 TABLET BY MOUTH EVERY DAY 90 tablet 1   spironolactone (ALDACTONE) 25 MG tablet TAKE 1 TABLET BY MOUTH EVERY DAY 90 tablet 0   Vitamin D, Ergocalciferol, (DRISDOL) 1.25 MG (50000  UNIT) CAPS capsule TAKE 1 CAPSULE ORALLY WEEKLY FOR LOW VIT D LEVEL 12 capsule 3   No current facility-administered medications for this visit.    Allergies:   Patient has no known allergies.    Social History:   reports that she has never smoked. She has never used smokeless tobacco. She reports that she does not drink alcohol and does not use drugs.   Family History:  family history is not on file.    ROS:     Review of Systems  Constitutional: Negative.   HENT: Negative.    Eyes: Negative.   Respiratory: Negative.    Gastrointestinal: Negative.   Genitourinary: Negative.   Musculoskeletal: Negative.   Skin: Negative.   Neurological: Negative.   Endo/Heme/Allergies: Negative.   Psychiatric/Behavioral: Negative.    All other systems reviewed and are negative.     All other systems are reviewed and negative.    PHYSICAL EXAM: VS:  BP 135/80   Pulse 77   Ht 5\' 5"  (1.651 m)   Wt 199 lb 9.6 oz (90.5 kg)   SpO2 97%   BMI 33.22 kg/m  , BMI Body mass index is 33.22 kg/m. Last weight:  Wt Readings from Last 3 Encounters:  09/23/23 199 lb 9.6 oz (90.5 kg)  09/22/23 201 lb (  91.2 kg)  03/24/23 196 lb 6.4 oz (89.1 kg)     Physical Exam Constitutional:      Appearance: Normal appearance.  Cardiovascular:     Rate and Rhythm: Normal rate and regular rhythm.     Heart sounds: Normal heart sounds.  Pulmonary:     Effort: Pulmonary effort is normal.     Breath sounds: Normal breath sounds.  Musculoskeletal:     Right lower leg: No edema.     Left lower leg: No edema.  Neurological:     Mental Status: She is alert.       EKG:   Recent Labs: 03/10/2023: Hemoglobin 14.4 09/16/2023: ALT 26; BUN 14; Creatinine, Ser 0.74; Potassium 4.1; Sodium 141; TSH 2.620    Lipid Panel    Component Value Date/Time   CHOL 128 09/16/2023 0950   TRIG 59 09/16/2023 0950   HDL 54 09/16/2023 0950   CHOLHDL 2.4 09/16/2023 0950   LDLCALC 61 09/16/2023 0950      Other studies  Reviewed: Additional studies/ records that were reviewed today include:  Review of the above records demonstrates:       No data to display            ASSESSMENT AND PLAN:    ICD-10-CM   1. Essential hypertension, benign  I10 MYOCARDIAL PERFUSION IMAGING    PCV ECHOCARDIOGRAM COMPLETE    furosemide (LASIX) 20 MG tablet    2. Mixed hyperlipidemia  E78.2 MYOCARDIAL PERFUSION IMAGING    PCV ECHOCARDIOGRAM COMPLETE    furosemide (LASIX) 20 MG tablet    3. Diastolic dysfunction  I51.89 MYOCARDIAL PERFUSION IMAGING    PCV ECHOCARDIOGRAM COMPLETE    furosemide (LASIX) 20 MG tablet    4. CHF (congestive heart failure), NYHA class III, acute on chronic, diastolic (HCC)  I50.33 MYOCARDIAL PERFUSION IMAGING    PCV ECHOCARDIOGRAM COMPLETE    furosemide (LASIX) 20 MG tablet   will give lasix 20    5. SOB (shortness of breath)  R06.02 MYOCARDIAL PERFUSION IMAGING    PCV ECHOCARDIOGRAM COMPLETE    furosemide (LASIX) 20 MG tablet   will do echo, stress test       Problem List Items Addressed This Visit       Cardiovascular and Mediastinum   Essential hypertension, benign - Primary   Relevant Medications   furosemide (LASIX) 20 MG tablet   Other Relevant Orders   MYOCARDIAL PERFUSION IMAGING   PCV ECHOCARDIOGRAM COMPLETE     Other   Mixed hyperlipidemia   Relevant Medications   furosemide (LASIX) 20 MG tablet   Other Relevant Orders   MYOCARDIAL PERFUSION IMAGING   PCV ECHOCARDIOGRAM COMPLETE   Diastolic dysfunction   Relevant Medications   furosemide (LASIX) 20 MG tablet   Other Relevant Orders   MYOCARDIAL PERFUSION IMAGING   PCV ECHOCARDIOGRAM COMPLETE   Other Visit Diagnoses     CHF (congestive heart failure), NYHA class III, acute on chronic, diastolic (HCC)       will give lasix 20   Relevant Medications   furosemide (LASIX) 20 MG tablet   Other Relevant Orders   MYOCARDIAL PERFUSION IMAGING   PCV ECHOCARDIOGRAM COMPLETE   SOB (shortness of breath)        will do echo, stress test   Relevant Medications   furosemide (LASIX) 20 MG tablet   Other Relevant Orders   MYOCARDIAL PERFUSION IMAGING   PCV ECHOCARDIOGRAM COMPLETE          Disposition:  Return in about 4 weeks (around 10/21/2023) for echo, stress test and f/u.    Total time spent: 30 minutes  Signed,  Adrian Blackwater, MD  09/23/2023 9:27 AM    [facility]

## 2023-10-05 ENCOUNTER — Ambulatory Visit (INDEPENDENT_AMBULATORY_CARE_PROVIDER_SITE_OTHER): Payer: Medicare HMO

## 2023-10-05 DIAGNOSIS — I10 Essential (primary) hypertension: Secondary | ICD-10-CM

## 2023-10-05 DIAGNOSIS — R0602 Shortness of breath: Secondary | ICD-10-CM | POA: Diagnosis not present

## 2023-10-05 DIAGNOSIS — I5189 Other ill-defined heart diseases: Secondary | ICD-10-CM | POA: Diagnosis not present

## 2023-10-05 DIAGNOSIS — E782 Mixed hyperlipidemia: Secondary | ICD-10-CM

## 2023-10-05 DIAGNOSIS — I5033 Acute on chronic diastolic (congestive) heart failure: Secondary | ICD-10-CM

## 2023-10-05 MED ORDER — TECHNETIUM TC 99M SESTAMIBI GENERIC - CARDIOLITE
10.3000 | Freq: Once | INTRAVENOUS | Status: AC | PRN
  Administered 2023-10-05: 10.3 via INTRAVENOUS

## 2023-10-05 MED ORDER — TECHNETIUM TC 99M SESTAMIBI GENERIC - CARDIOLITE
33.5000 | Freq: Once | INTRAVENOUS | Status: AC | PRN
  Administered 2023-10-05: 33.5 via INTRAVENOUS

## 2023-10-07 ENCOUNTER — Ambulatory Visit (INDEPENDENT_AMBULATORY_CARE_PROVIDER_SITE_OTHER): Payer: Medicare HMO

## 2023-10-07 DIAGNOSIS — I5189 Other ill-defined heart diseases: Secondary | ICD-10-CM | POA: Diagnosis not present

## 2023-10-07 DIAGNOSIS — I10 Essential (primary) hypertension: Secondary | ICD-10-CM

## 2023-10-07 DIAGNOSIS — R0602 Shortness of breath: Secondary | ICD-10-CM

## 2023-10-07 DIAGNOSIS — E782 Mixed hyperlipidemia: Secondary | ICD-10-CM

## 2023-10-07 DIAGNOSIS — I5033 Acute on chronic diastolic (congestive) heart failure: Secondary | ICD-10-CM

## 2023-10-10 ENCOUNTER — Encounter: Payer: Self-pay | Admitting: Cardiovascular Disease

## 2023-10-10 ENCOUNTER — Ambulatory Visit (INDEPENDENT_AMBULATORY_CARE_PROVIDER_SITE_OTHER): Payer: Medicare HMO | Admitting: Cardiovascular Disease

## 2023-10-10 VITALS — BP 122/80 | HR 64 | Ht 65.0 in | Wt 201.6 lb

## 2023-10-10 DIAGNOSIS — R0602 Shortness of breath: Secondary | ICD-10-CM | POA: Diagnosis not present

## 2023-10-10 DIAGNOSIS — I10 Essential (primary) hypertension: Secondary | ICD-10-CM | POA: Diagnosis not present

## 2023-10-10 DIAGNOSIS — I5033 Acute on chronic diastolic (congestive) heart failure: Secondary | ICD-10-CM | POA: Diagnosis not present

## 2023-10-10 DIAGNOSIS — E782 Mixed hyperlipidemia: Secondary | ICD-10-CM

## 2023-10-10 NOTE — Progress Notes (Signed)
Cardiology Office Note   Date:  10/10/2023   ID:  Arnola, Nordby 10/08/1946, MRN [id number]  PCP:  Marisue Ivan, NP  Cardiologist:  Adrian Blackwater, MD      History of Present Illness: Cristina Roberts is a 77 y.o. female who presents for No chief complaint on file.   Doing well      Past Medical History:  Diagnosis Date   Arthritis    Osteoarthritis and Rheumatoid    Cancer (HCC)    Lung    Chronic kidney disease    Heart disease    Hyperlipidemia    Hypertension      Past Surgical History:  Procedure Laterality Date   COLONOSCOPY WITH PROPOFOL N/A 02/07/2018   Procedure: COLONOSCOPY WITH PROPOFOL;  Surgeon: Christena Deem, MD;  Location: [facility] ENDOSCOPY;  Service: Endoscopy;  Laterality: N/A;   fatty tumor removed     TOTAL HIP ARTHROPLASTY Right 09/10/2015   Procedure: TOTAL HIP ARTHROPLASTY;  Surgeon: Deeann Saint, MD;  Location: [facility] ORS;  Service: Orthopedics;  Laterality: Right;   TUBAL LIGATION       Current Outpatient Medications  Medication Sig Dispense Refill   acetaminophen (TYLENOL) 500 MG tablet Take 2 tablets (1,000 mg total) by mouth every 6 (six) hours as needed for moderate pain. 30 tablet 0   amLODipine (NORVASC) 5 MG tablet TAKE 1 TABLET BY MOUTH EVERY DAY 90 tablet 3   Calcium Carbonate-Vitamin D (CALCIUM 600+D) 600-400 MG-UNIT per tablet Take 1 tablet by mouth daily.     cholecalciferol (VITAMIN D) 400 units TABS tablet Take 400 Units by mouth.     furosemide (LASIX) 20 MG tablet Take 1 tablet (20 mg total) by mouth daily. 30 tablet 11   lisinopril-hydrochlorothiazide (ZESTORETIC) 20-25 MG tablet TAKE 1 TABLET BY MOUTH EVERY DAY 90 tablet 3   rosuvastatin (CRESTOR) 20 MG tablet TAKE 1 TABLET BY MOUTH EVERY DAY 90 tablet 1   spironolactone (ALDACTONE) 25 MG tablet TAKE 1 TABLET BY MOUTH EVERY DAY 90 tablet 0   Vitamin D, Ergocalciferol, (DRISDOL) 1.25 MG (50000 UNIT) CAPS capsule TAKE 1 CAPSULE ORALLY WEEKLY  FOR LOW VIT D LEVEL 12 capsule 3   No current facility-administered medications for this visit.    Allergies:   Patient has no known allergies.    Social History:   reports that she has never smoked. She has never used smokeless tobacco. She reports that she does not drink alcohol and does not use drugs.   Family History:  family history is not on file.    ROS:     Review of Systems  Constitutional: Negative.   HENT: Negative.    Eyes: Negative.   Respiratory: Negative.    Gastrointestinal: Negative.   Genitourinary: Negative.   Musculoskeletal: Negative.   Skin: Negative.   Neurological: Negative.   Endo/Heme/Allergies: Negative.   Psychiatric/Behavioral: Negative.    All other systems reviewed and are negative.     All other systems are reviewed and negative.    PHYSICAL EXAM: VS:  BP 122/80   Pulse 64   Wt 201 lb 9.6 oz (91.4 kg)   SpO2 97%   BMI 33.55 kg/m  , BMI Body mass index is 33.55 kg/m. Last weight:  Wt Readings from Last 3 Encounters:  10/10/23 201 lb 9.6 oz (91.4 kg)  09/23/23 199 lb 9.6 oz (90.5 kg)  09/22/23 201 lb (91.2 kg)     Physical Exam Constitutional:  Appearance: Normal appearance.  Cardiovascular:     Rate and Rhythm: Normal rate and regular rhythm.     Heart sounds: Normal heart sounds.  Pulmonary:     Effort: Pulmonary effort is normal.     Breath sounds: Normal breath sounds.  Musculoskeletal:     Right lower leg: No edema.     Left lower leg: No edema.  Neurological:     Mental Status: She is alert.       EKG:   Recent Labs: 03/10/2023: Hemoglobin 14.4 09/16/2023: ALT 26; BUN 14; Creatinine, Ser 0.74; Potassium 4.1; Sodium 141; TSH 2.620    Lipid Panel    Component Value Date/Time   CHOL 128 09/16/2023 0950   TRIG 59 09/16/2023 0950   HDL 54 09/16/2023 0950   CHOLHDL 2.4 09/16/2023 0950   LDLCALC 61 09/16/2023 0950      Other studies Reviewed: Additional studies/ records that were reviewed today  include:  Review of the above records demonstrates:       No data to display            ASSESSMENT AND PLAN:    ICD-10-CM   1. Essential hypertension, benign  I10    stable    2. Mixed hyperlipidemia  E78.2     3. CHF (congestive heart failure), NYHA class III, acute on chronic, diastolic (HCC)  I50.33     4. SOB (shortness of breath)  R06.02    LVEF 56%, diastolic dysfunction       Problem List Items Addressed This Visit       Cardiovascular and Mediastinum   Essential hypertension, benign - Primary     Other   Mixed hyperlipidemia   Other Visit Diagnoses     CHF (congestive heart failure), NYHA class III, acute on chronic, diastolic (HCC)       SOB (shortness of breath)       LVEF 56%, diastolic dysfunction          Disposition:   Return in about 3 months (around 01/10/2024).    Total time spent: 30 minutes  Signed,  Adrian Blackwater, MD  10/10/2023 9:52 AM    [facility]

## 2023-12-02 ENCOUNTER — Other Ambulatory Visit: Payer: Self-pay | Admitting: Cardiovascular Disease

## 2023-12-02 DIAGNOSIS — R609 Edema, unspecified: Secondary | ICD-10-CM

## 2024-01-10 ENCOUNTER — Ambulatory Visit: Payer: Medicare HMO | Admitting: Cardiovascular Disease

## 2024-01-10 ENCOUNTER — Encounter: Payer: Self-pay | Admitting: Cardiovascular Disease

## 2024-01-10 VITALS — BP 120/70 | HR 83 | Ht 65.0 in | Wt 198.0 lb

## 2024-01-10 DIAGNOSIS — R0602 Shortness of breath: Secondary | ICD-10-CM

## 2024-01-10 DIAGNOSIS — I5032 Chronic diastolic (congestive) heart failure: Secondary | ICD-10-CM

## 2024-01-10 DIAGNOSIS — I10 Essential (primary) hypertension: Secondary | ICD-10-CM | POA: Diagnosis not present

## 2024-01-10 DIAGNOSIS — E782 Mixed hyperlipidemia: Secondary | ICD-10-CM | POA: Diagnosis not present

## 2024-01-10 NOTE — Progress Notes (Signed)
Cardiology Office Note   Date:  01/10/2024   ID:  Cristina, Roberts 05/26/1946, MRN [id number]  PCP:  Marisue Ivan, NP  Cardiologist:  Adrian Blackwater, MD      History of Present Illness: Cristina Roberts is a 78 y.o. female who presents for  Chief Complaint  Patient presents with   Follow-up    3 Months Follow Up    No chest pain or SOB      Past Medical History:  Diagnosis Date   Arthritis    Osteoarthritis and Rheumatoid    Cancer (HCC)    Lung    Chronic kidney disease    Heart disease    Hyperlipidemia    Hypertension      Past Surgical History:  Procedure Laterality Date   COLONOSCOPY WITH PROPOFOL N/A 02/07/2018   Procedure: COLONOSCOPY WITH PROPOFOL;  Surgeon: Christena Deem, MD;  Location: [facility] ENDOSCOPY;  Service: Endoscopy;  Laterality: N/A;   fatty tumor removed     TOTAL HIP ARTHROPLASTY Right 09/10/2015   Procedure: TOTAL HIP ARTHROPLASTY;  Surgeon: Deeann Saint, MD;  Location: [facility] ORS;  Service: Orthopedics;  Laterality: Right;   TUBAL LIGATION       Current Outpatient Medications  Medication Sig Dispense Refill   acetaminophen (TYLENOL) 500 MG tablet Take 2 tablets (1,000 mg total) by mouth every 6 (six) hours as needed for moderate pain. 30 tablet 0   amLODipine (NORVASC) 5 MG tablet TAKE 1 TABLET BY MOUTH EVERY DAY 90 tablet 3   Calcium Carbonate-Vitamin D (CALCIUM 600+D) 600-400 MG-UNIT per tablet Take 1 tablet by mouth daily.     cholecalciferol (VITAMIN D) 400 units TABS tablet Take 400 Units by mouth.     furosemide (LASIX) 20 MG tablet Take 1 tablet (20 mg total) by mouth daily. 30 tablet 11   lisinopril-hydrochlorothiazide (ZESTORETIC) 20-25 MG tablet TAKE 1 TABLET BY MOUTH EVERY DAY 90 tablet 3   rosuvastatin (CRESTOR) 20 MG tablet TAKE 1 TABLET BY MOUTH EVERY DAY 90 tablet 1   spironolactone (ALDACTONE) 25 MG tablet TAKE 1 TABLET BY MOUTH EVERY DAY 90 tablet 0   Vitamin D, Ergocalciferol, (DRISDOL) 1.25  MG (50000 UNIT) CAPS capsule TAKE 1 CAPSULE ORALLY WEEKLY FOR LOW VIT D LEVEL 12 capsule 3   No current facility-administered medications for this visit.    Allergies:   Patient has no known allergies.    Social History:   reports that she has never smoked. She has never used smokeless tobacco. She reports that she does not drink alcohol and does not use drugs.   Family History:  family history is not on file.    ROS:     Review of Systems  Constitutional: Negative.   HENT: Negative.    Eyes: Negative.   Respiratory: Negative.    Gastrointestinal: Negative.   Genitourinary: Negative.   Musculoskeletal: Negative.   Skin: Negative.   Neurological: Negative.   Endo/Heme/Allergies: Negative.   Psychiatric/Behavioral: Negative.    All other systems reviewed and are negative.     All other systems are reviewed and negative.    PHYSICAL EXAM: VS:  BP 120/70   Pulse 83   Ht 5\' 5"  (1.651 m)   Wt 198 lb (89.8 kg)   SpO2 98%   BMI 32.95 kg/m  , BMI Body mass index is 32.95 kg/m. Last weight:  Wt Readings from Last 3 Encounters:  01/10/24 198 lb (89.8 kg)  10/10/23 201 lb 9.6  oz (91.4 kg)  09/23/23 199 lb 9.6 oz (90.5 kg)     Physical Exam Constitutional:      Appearance: Normal appearance.  Cardiovascular:     Rate and Rhythm: Normal rate and regular rhythm.     Heart sounds: Normal heart sounds.  Pulmonary:     Effort: Pulmonary effort is normal.     Breath sounds: Normal breath sounds.  Musculoskeletal:     Right lower leg: No edema.     Left lower leg: No edema.  Neurological:     Mental Status: She is alert.       EKG:   Recent Labs: 03/10/2023: Hemoglobin 14.4 09/16/2023: ALT 26; BUN 14; Creatinine, Ser 0.74; Potassium 4.1; Sodium 141; TSH 2.620    Lipid Panel    Component Value Date/Time   CHOL 128 09/16/2023 0950   TRIG 59 09/16/2023 0950   HDL 54 09/16/2023 0950   CHOLHDL 2.4 09/16/2023 0950   LDLCALC 61 09/16/2023 0950      Other  studies Reviewed: Additional studies/ records that were reviewed today include:  Review of the above records demonstrates:       No data to display            ASSESSMENT AND PLAN:    ICD-10-CM   1. Mixed hyperlipidemia  E78.2     2. Essential hypertension, benign  I10     3. SOB (shortness of breath)  R06.02     4. CHF (congestive heart failure), NYHA class I, chronic, diastolic (HCC)  I50.32    compensated on aldactone       Problem List Items Addressed This Visit       Cardiovascular and Mediastinum   Essential hypertension, benign     Other   Mixed hyperlipidemia - Primary   Other Visit Diagnoses       SOB (shortness of breath)         CHF (congestive heart failure), NYHA class I, chronic, diastolic (HCC)       compensated on aldactone          Disposition:   Return in about 3 months (around 04/09/2024).    Total time spent: 35 minutes  Signed,  Adrian Blackwater, MD  01/10/2024 9:45 AM    [facility]

## 2024-01-19 ENCOUNTER — Other Ambulatory Visit: Payer: Medicare HMO

## 2024-01-19 ENCOUNTER — Ambulatory Visit: Payer: Medicare HMO

## 2024-01-19 DIAGNOSIS — E782 Mixed hyperlipidemia: Secondary | ICD-10-CM

## 2024-01-19 DIAGNOSIS — Z1329 Encounter for screening for other suspected endocrine disorder: Secondary | ICD-10-CM

## 2024-01-19 DIAGNOSIS — R7303 Prediabetes: Secondary | ICD-10-CM | POA: Diagnosis not present

## 2024-01-19 DIAGNOSIS — I10 Essential (primary) hypertension: Secondary | ICD-10-CM | POA: Diagnosis not present

## 2024-01-20 LAB — CBC WITH DIFFERENTIAL/PLATELET
Basophils Absolute: 0 10*3/uL (ref 0.0–0.2)
Basos: 0 %
EOS (ABSOLUTE): 0.2 10*3/uL (ref 0.0–0.4)
Eos: 3 %
Hematocrit: 40.7 % (ref 34.0–46.6)
Hemoglobin: 13.3 g/dL (ref 11.1–15.9)
Immature Grans (Abs): 0 10*3/uL (ref 0.0–0.1)
Immature Granulocytes: 0 %
Lymphocytes Absolute: 2 10*3/uL (ref 0.7–3.1)
Lymphs: 38 %
MCH: 30.4 pg (ref 26.6–33.0)
MCHC: 32.7 g/dL (ref 31.5–35.7)
MCV: 93 fL (ref 79–97)
Monocytes Absolute: 0.5 10*3/uL (ref 0.1–0.9)
Monocytes: 9 %
Neutrophils Absolute: 2.7 10*3/uL (ref 1.4–7.0)
Neutrophils: 50 %
Platelets: 194 10*3/uL (ref 150–450)
RBC: 4.38 x10E6/uL (ref 3.77–5.28)
RDW: 13 % (ref 11.7–15.4)
WBC: 5.4 10*3/uL (ref 3.4–10.8)

## 2024-01-20 LAB — CMP14+EGFR
ALT: 18 [IU]/L (ref 0–32)
AST: 19 [IU]/L (ref 0–40)
Albumin: 3.9 g/dL (ref 3.8–4.8)
Alkaline Phosphatase: 48 [IU]/L (ref 44–121)
BUN/Creatinine Ratio: 17 (ref 12–28)
BUN: 14 mg/dL (ref 8–27)
Bilirubin Total: 0.4 mg/dL (ref 0.0–1.2)
CO2: 22 mmol/L (ref 20–29)
Calcium: 9.6 mg/dL (ref 8.7–10.3)
Chloride: 106 mmol/L (ref 96–106)
Creatinine, Ser: 0.82 mg/dL (ref 0.57–1.00)
Globulin, Total: 2.9 g/dL (ref 1.5–4.5)
Glucose: 99 mg/dL (ref 70–99)
Potassium: 4.2 mmol/L (ref 3.5–5.2)
Sodium: 141 mmol/L (ref 134–144)
Total Protein: 6.8 g/dL (ref 6.0–8.5)
eGFR: 74 mL/min/{1.73_m2} (ref >59)

## 2024-01-20 LAB — TSH: TSH: 2.13 u[IU]/mL (ref 0.450–4.500)

## 2024-01-20 LAB — LIPID PANEL
Chol/HDL Ratio: 2.7 {ratio} (ref 0.0–4.4)
Cholesterol, Total: 133 mg/dL (ref 100–199)
HDL: 50 mg/dL (ref >39)
LDL Chol Calc (NIH): 68 mg/dL (ref 0–99)
Triglycerides: 75 mg/dL (ref 0–149)
VLDL Cholesterol Cal: 15 mg/dL (ref 5–40)

## 2024-01-20 LAB — HEMOGLOBIN A1C
Est. average glucose Bld gHb Est-mCnc: 126 mg/dL
Hgb A1c MFr Bld: 6 % — ABNORMAL HIGH (ref 4.8–5.6)

## 2024-01-23 ENCOUNTER — Encounter: Payer: Self-pay | Admitting: Cardiology

## 2024-01-23 ENCOUNTER — Ambulatory Visit: Payer: Medicare HMO | Admitting: Cardiology

## 2024-01-23 VITALS — BP 108/60 | HR 82 | Ht 65.0 in | Wt 195.0 lb

## 2024-01-23 DIAGNOSIS — R7303 Prediabetes: Secondary | ICD-10-CM

## 2024-01-23 DIAGNOSIS — E782 Mixed hyperlipidemia: Secondary | ICD-10-CM

## 2024-01-23 DIAGNOSIS — I10 Essential (primary) hypertension: Secondary | ICD-10-CM | POA: Diagnosis not present

## 2024-01-23 DIAGNOSIS — K921 Melena: Secondary | ICD-10-CM

## 2024-01-23 LAB — POC HEMOCCULT BLD/STL (HOME/3-CARD/SCREEN)
Card #2 Fecal Occult Blod, POC: NEGATIVE
Card #3 Fecal Occult Blood, POC: NEGATIVE
Fecal Occult Blood, POC: NEGATIVE

## 2024-01-23 NOTE — Progress Notes (Signed)
Established Patient Office Visit  Subjective:  Patient ID: Cristina Roberts, female    DOB: February 27, 1946  Age: 78 y.o. MRN: [id number]  Chief Complaint  Patient presents with   Follow-up    4 Months Follow Up    Patient in office for 4 month follow up, discuss recent lab results. Patient reports feeling well, states she had one episode of bright red blood in her stool. Patient given hemoccult stool card 3 days ago, returned today, negative for blood. CBC normal. Patient confirms she has hemorrhoids, blood likely due to hemorrhoids.  LDL at goal. Hgb A1c stable. Patient up to date on routine maintenance exams.  Continue same medications.     No other concerns at this time.   Past Medical History:  Diagnosis Date   Arthritis    Osteoarthritis and Rheumatoid    Cancer (HCC)    Lung    Chronic kidney disease    Heart disease    Hyperlipidemia    Hypertension     Past Surgical History:  Procedure Laterality Date   COLONOSCOPY WITH PROPOFOL N/A 02/07/2018   Procedure: COLONOSCOPY WITH PROPOFOL;  Surgeon: Christena Deem, MD;  Location: [facility] ENDOSCOPY;  Service: Endoscopy;  Laterality: N/A;   fatty tumor removed     TOTAL HIP ARTHROPLASTY Right 09/10/2015   Procedure: TOTAL HIP ARTHROPLASTY;  Surgeon: Deeann Saint, MD;  Location: [facility] ORS;  Service: Orthopedics;  Laterality: Right;   TUBAL LIGATION      Social History   Socioeconomic History   Marital status: Divorced    Spouse name: Not on file   Number of children: Not on file   Years of education: Not on file   Highest education level: Not on file  Occupational History   Not on file  Tobacco Use   Smoking status: Never   Smokeless tobacco: Never  Vaping Use   Vaping status: Never Used  Substance and Sexual Activity   Alcohol use: No    Alcohol/week: 0.0 standard drinks of alcohol   Drug use: No   Sexual activity: Not on file  Other Topics Concern   Not on file  Social History Narrative   Not on  file   Social Drivers of Health   Financial Resource Strain: Not on file  Food Insecurity: No Food Insecurity (02/14/2020)   Received from [facility], [facility]   Hunger Vital Sign    Worried About Running Out of Food in the Last Year: Never true    Ran Out of Food in the Last Year: Never true  Transportation Needs: Not on file  Physical Activity: Not on file  Stress: Not on file  Social Connections: Not on file  Intimate Partner Violence: Not on file    Family History  Problem Relation Age of Onset   Breast cancer Neg Hx     No Known Allergies  Outpatient Medications Prior to Visit  Medication Sig   acetaminophen (TYLENOL) 500 MG tablet Take 2 tablets (1,000 mg total) by mouth every 6 (six) hours as needed for moderate pain.   amLODipine (NORVASC) 5 MG tablet TAKE 1 TABLET BY MOUTH EVERY DAY   Calcium Carbonate-Vitamin D (CALCIUM 600+D) 600-400 MG-UNIT per tablet Take 1 tablet by mouth daily.   cholecalciferol (VITAMIN D) 400 units TABS tablet Take 400 Units by mouth.   furosemide (LASIX) 20 MG tablet Take 1 tablet (20 mg total) by mouth daily.   lisinopril-hydrochlorothiazide (ZESTORETIC) 20-25 MG tablet TAKE 1  TABLET BY MOUTH EVERY DAY   rosuvastatin (CRESTOR) 20 MG tablet TAKE 1 TABLET BY MOUTH EVERY DAY   spironolactone (ALDACTONE) 25 MG tablet TAKE 1 TABLET BY MOUTH EVERY DAY   Vitamin D, Ergocalciferol, (DRISDOL) 1.25 MG (50000 UNIT) CAPS capsule TAKE 1 CAPSULE ORALLY WEEKLY FOR LOW VIT D LEVEL   No facility-administered medications prior to visit.    Review of Systems  Constitutional: Negative.   HENT: Negative.    Eyes: Negative.   Respiratory: Negative.  Negative for shortness of breath.   Cardiovascular: Negative.  Negative for chest pain.  Gastrointestinal:  Positive for blood in stool. Negative for abdominal pain, constipation and diarrhea.  Genitourinary: Negative.   Musculoskeletal:  Negative for joint pain and myalgias.  Skin: Negative.    Neurological: Negative.  Negative for dizziness and headaches.  Endo/Heme/Allergies: Negative.   All other systems reviewed and are negative.      Objective:   BP 108/60   Pulse 82   Ht 5\' 5"  (1.651 m)   Wt 195 lb (88.5 kg)   SpO2 95%   BMI 32.45 kg/m   Vitals:   01/23/24 0923  BP: 108/60  Pulse: 82  Height: 5\' 5"  (1.651 m)  Weight: 195 lb (88.5 kg)  SpO2: 95%  BMI (Calculated): 32.45    Physical Exam Vitals and nursing note reviewed.  Constitutional:      Appearance: Normal appearance. She is normal weight.  HENT:     Head: Normocephalic and atraumatic.     Nose: Nose normal.     Mouth/Throat:     Mouth: Mucous membranes are moist.  Eyes:     Extraocular Movements: Extraocular movements intact.     Conjunctiva/sclera: Conjunctivae normal.     Pupils: Pupils are equal, round, and reactive to light.  Cardiovascular:     Rate and Rhythm: Normal rate and regular rhythm.     Pulses: Normal pulses.     Heart sounds: Normal heart sounds.  Pulmonary:     Effort: Pulmonary effort is normal.     Breath sounds: Normal breath sounds.  Abdominal:     General: Abdomen is flat. Bowel sounds are normal.     Palpations: Abdomen is soft.  Musculoskeletal:        General: Normal range of motion.     Cervical back: Normal range of motion.  Skin:    General: Skin is warm and dry.  Neurological:     General: No focal deficit present.     Mental Status: She is alert and oriented to person, place, and time.  Psychiatric:        Mood and Affect: Mood normal.        Behavior: Behavior normal.        Thought Content: Thought content normal.        Judgment: Judgment normal.      Results for orders placed or performed in visit on 01/23/24  POC Hemoccult Bld/Stl (3-Cd Home Screen)  Result Value Ref Range   Card #1 Date 01/19/2024    Fecal Occult Blood, POC Negative Negative   Card #2 Date 01/21/2024    Card #2 Fecal Occult Blod, POC Negative    Card #3 Date 01/23/2024     Card #3 Fecal Occult Blood, POC Negative     Recent Results (from the past 2160 hours)  CBC with Differential/Platelet     Status: None   Collection Time: 01/19/24  9:24 AM  Result Value Ref Range  WBC 5.4 3.4 - 10.8 x10E3/uL   RBC 4.38 3.77 - 5.28 x10E6/uL   Hemoglobin 13.3 11.1 - 15.9 g/dL   Hematocrit 60.4 54.0 - 46.6 %   MCV 93 79 - 97 fL   MCH 30.4 26.6 - 33.0 pg   MCHC 32.7 31.5 - 35.7 g/dL   RDW 98.1 19.1 - 47.8 %   Platelets 194 150 - 450 x10E3/uL   Neutrophils 50 Not Estab. %   Lymphs 38 Not Estab. %   Monocytes 9 Not Estab. %   Eos 3 Not Estab. %   Basos 0 Not Estab. %   Neutrophils Absolute 2.7 1.4 - 7.0 x10E3/uL   Lymphocytes Absolute 2.0 0.7 - 3.1 x10E3/uL   Monocytes Absolute 0.5 0.1 - 0.9 x10E3/uL   EOS (ABSOLUTE) 0.2 0.0 - 0.4 x10E3/uL   Basophils Absolute 0.0 0.0 - 0.2 x10E3/uL   Immature Granulocytes 0 Not Estab. %   Immature Grans (Abs) 0.0 0.0 - 0.1 x10E3/uL  TSH     Status: None   Collection Time: 01/19/24  9:24 AM  Result Value Ref Range   TSH 2.130 0.450 - 4.500 uIU/mL  CMP14+EGFR     Status: None   Collection Time: 01/19/24  9:24 AM  Result Value Ref Range   Glucose 99 70 - 99 mg/dL   BUN 14 8 - 27 mg/dL   Creatinine, Ser 2.95 0.57 - 1.00 mg/dL   eGFR 74 >62 ZH/YQM/5.78   BUN/Creatinine Ratio 17 12 - 28   Sodium 141 134 - 144 mmol/L   Potassium 4.2 3.5 - 5.2 mmol/L   Chloride 106 96 - 106 mmol/L   CO2 22 20 - 29 mmol/L   Calcium 9.6 8.7 - 10.3 mg/dL   Total Protein 6.8 6.0 - 8.5 g/dL   Albumin 3.9 3.8 - 4.8 g/dL   Globulin, Total 2.9 1.5 - 4.5 g/dL   Bilirubin Total 0.4 0.0 - 1.2 mg/dL   Alkaline Phosphatase 48 44 - 121 IU/L   AST 19 0 - 40 IU/L   ALT 18 0 - 32 IU/L  Hemoglobin A1c     Status: Abnormal   Collection Time: 01/19/24  9:24 AM  Result Value Ref Range   Hgb A1c MFr Bld 6.0 (H) 4.8 - 5.6 %    Comment:          Prediabetes: 5.7 - 6.4          Diabetes: >6.4          Glycemic control for adults with diabetes: <7.0    Est.  average glucose Bld gHb Est-mCnc 126 mg/dL  Lipid panel     Status: None   Collection Time: 01/19/24  9:24 AM  Result Value Ref Range   Cholesterol, Total 133 100 - 199 mg/dL   Triglycerides 75 0 - 149 mg/dL   HDL 50 >46 mg/dL   VLDL Cholesterol Cal 15 5 - 40 mg/dL   LDL Chol Calc (NIH) 68 0 - 99 mg/dL   Chol/HDL Ratio 2.7 0.0 - 4.4 ratio    Comment:                                   T. Chol/HDL Ratio  Men  Women                               1/2 Avg.Risk  3.4    3.3                                   Avg.Risk  5.0    4.4                                2X Avg.Risk  9.6    7.1                                3X Avg.Risk 23.4   11.0   POC Hemoccult Bld/Stl (3-Cd Home Screen)     Status: Normal   Collection Time: 01/23/24  9:24 AM  Result Value Ref Range   Card #1 Date 01/19/2024    Fecal Occult Blood, POC Negative Negative   Card #2 Date 01/21/2024    Card #2 Fecal Occult Blod, POC Negative    Card #3 Date 01/23/2024    Card #3 Fecal Occult Blood, POC Negative       Assessment & Plan:  Continue same medications.   Problem List Items Addressed This Visit       Cardiovascular and Mediastinum   Essential hypertension, benign     Other   Mixed hyperlipidemia   Prediabetes   Other Visit Diagnoses       Blood in stool    -  Primary   Relevant Orders   POC Hemoccult Bld/Stl (3-Cd Home Screen) (Completed)       Return in about 4 months (around 05/22/2024) for with fasting labs prior .   Total time spent: 25 minutes  [employer], NP  01/23/2024   This document may have been prepared by Dragon Voice Recognition software and as such may include unintentional dictation errors.

## 2024-02-25 ENCOUNTER — Other Ambulatory Visit: Payer: Self-pay | Admitting: Cardiovascular Disease

## 2024-02-25 DIAGNOSIS — R609 Edema, unspecified: Secondary | ICD-10-CM

## 2024-02-26 ENCOUNTER — Other Ambulatory Visit: Payer: Self-pay | Admitting: Cardiovascular Disease

## 2024-02-26 DIAGNOSIS — E785 Hyperlipidemia, unspecified: Secondary | ICD-10-CM

## 2024-02-27 ENCOUNTER — Other Ambulatory Visit: Payer: Self-pay

## 2024-02-27 DIAGNOSIS — I10 Essential (primary) hypertension: Secondary | ICD-10-CM

## 2024-02-27 MED ORDER — AMLODIPINE BESYLATE 5 MG PO TABS: 5.0000 mg | ORAL_TABLET | Freq: Every day | ORAL | 3 refills | Status: DC

## 2024-03-19 ENCOUNTER — Other Ambulatory Visit: Payer: Self-pay | Admitting: Family

## 2024-03-19 DIAGNOSIS — I10 Essential (primary) hypertension: Secondary | ICD-10-CM

## 2024-04-10 ENCOUNTER — Encounter: Payer: Self-pay | Admitting: Cardiovascular Disease

## 2024-04-10 ENCOUNTER — Ambulatory Visit: Payer: Medicare HMO | Admitting: Cardiovascular Disease

## 2024-04-10 VITALS — BP 115/62 | HR 91 | Ht 65.0 in | Wt 199.2 lb

## 2024-04-10 DIAGNOSIS — R0602 Shortness of breath: Secondary | ICD-10-CM

## 2024-04-10 DIAGNOSIS — I10 Essential (primary) hypertension: Secondary | ICD-10-CM | POA: Diagnosis not present

## 2024-04-10 DIAGNOSIS — I5033 Acute on chronic diastolic (congestive) heart failure: Secondary | ICD-10-CM

## 2024-04-10 DIAGNOSIS — E782 Mixed hyperlipidemia: Secondary | ICD-10-CM | POA: Diagnosis not present

## 2024-04-10 DIAGNOSIS — I5032 Chronic diastolic (congestive) heart failure: Secondary | ICD-10-CM

## 2024-04-10 NOTE — Progress Notes (Signed)
 Cardiology Office Note   Date:  04/10/2024   ID:  Cristina Roberts, DOB 1946/11/29, MRN [id number]  PCP:  Alica Antu, NP  Cardiologist:  Debborah Fairly, MD      History of Present Illness: Cristina Roberts is a 78 y.o. female who presents for  Chief Complaint  Patient presents with   Follow-up    3 month follow up     Doing well, feels tired      Past Medical History:  Diagnosis Date   Arthritis    Osteoarthritis and Rheumatoid    Cancer (HCC)    Lung    Chronic kidney disease    Heart disease    Hyperlipidemia    Hypertension      Past Surgical History:  Procedure Laterality Date   COLONOSCOPY WITH PROPOFOL  N/A 02/07/2018   Procedure: COLONOSCOPY WITH PROPOFOL ;  Surgeon: Deveron Fly, MD;  Location: [facility] ENDOSCOPY;  Service: Endoscopy;  Laterality: N/A;   fatty tumor removed     TOTAL HIP ARTHROPLASTY Right 09/10/2015   Procedure: TOTAL HIP ARTHROPLASTY;  Surgeon: Marlynn Singer, MD;  Location: [facility] ORS;  Service: Orthopedics;  Laterality: Right;   TUBAL LIGATION       Current Outpatient Medications  Medication Sig Dispense Refill   acetaminophen  (TYLENOL ) 500 MG tablet Take 2 tablets (1,000 mg total) by mouth every 6 (six) hours as needed for moderate pain. 30 tablet 0   amLODipine  (NORVASC ) 5 MG tablet Take 1 tablet (5 mg total) by mouth daily. 90 tablet 3   Calcium  Carbonate-Vitamin D  (CALCIUM  600+D) 600-400 MG-UNIT per tablet Take 1 tablet by mouth daily.     cholecalciferol (VITAMIN D ) 400 units TABS tablet Take 400 Units by mouth.     furosemide  (LASIX ) 20 MG tablet Take 1 tablet (20 mg total) by mouth daily. 30 tablet 11   lisinopril-hydrochlorothiazide  (ZESTORETIC) 20-25 MG tablet TAKE 1 TABLET BY MOUTH EVERY DAY 90 tablet 3   rosuvastatin (CRESTOR) 20 MG tablet TAKE 1 TABLET BY MOUTH EVERY DAY 90 tablet 1   spironolactone (ALDACTONE) 25 MG tablet TAKE 1 TABLET BY MOUTH EVERY DAY 90 tablet 0   Vitamin D , Ergocalciferol ,  (DRISDOL) 1.25 MG (50000 UNIT) CAPS capsule TAKE 1 CAPSULE ORALLY WEEKLY FOR LOW VIT D LEVEL 12 capsule 3   No current facility-administered medications for this visit.    Allergies:   Patient has no known allergies.    Social History:   reports that she has never smoked. She has never used smokeless tobacco. She reports that she does not drink alcohol and does not use drugs.   Family History:  family history is not on file.    ROS:     Review of Systems  Constitutional: Negative.   HENT: Negative.    Eyes: Negative.   Respiratory: Negative.    Gastrointestinal: Negative.   Genitourinary: Negative.   Musculoskeletal: Negative.   Skin: Negative.   Neurological: Negative.   Endo/Heme/Allergies: Negative.   Psychiatric/Behavioral: Negative.    All other systems reviewed and are negative.     All other systems are reviewed and negative.    PHYSICAL EXAM: VS:  BP 115/62   Pulse 91   Ht 5\' 5"  (1.651 m)   Wt 199 lb 3.2 oz (90.4 kg)   SpO2 95%   BMI 33.15 kg/m  , BMI Body mass index is 33.15 kg/m. Last weight:  Wt Readings from Last 3 Encounters:  04/10/24 199 lb 3.2 oz (90.4  kg)  01/23/24 195 lb (88.5 kg)  01/10/24 198 lb (89.8 kg)     Physical Exam Constitutional:      Appearance: Normal appearance.  Cardiovascular:     Rate and Rhythm: Normal rate and regular rhythm.     Heart sounds: Normal heart sounds.  Pulmonary:     Effort: Pulmonary effort is normal.     Breath sounds: Normal breath sounds.  Musculoskeletal:     Right lower leg: No edema.     Left lower leg: No edema.  Neurological:     Mental Status: She is alert.       EKG:   Recent Labs: 01/19/2024: ALT 18; BUN 14; Creatinine, Ser 0.82; Hemoglobin 13.3; Platelets 194; Potassium 4.2; Sodium 141; TSH 2.130    Lipid Panel    Component Value Date/Time   CHOL 133 01/19/2024 0924   TRIG 75 01/19/2024 0924   HDL 50 01/19/2024 0924   CHOLHDL 2.7 01/19/2024 0924   LDLCALC 68 01/19/2024 0924       Other studies Reviewed: Additional studies/ records that were reviewed today include:  Review of the above records demonstrates:       No data to display            ASSESSMENT AND PLAN:    ICD-10-CM   1. Mixed hyperlipidemia  E78.2     2. Essential hypertension, benign  I10     3. SOB (shortness of breath)  R06.02     4. CHF (congestive heart failure), NYHA class I, chronic, diastolic (HCC)  I50.32     5. CHF (congestive heart failure), NYHA class III, acute on chronic, diastolic (HCC)  I50.33    doing well, feel tired, but on GDMT       Problem List Items Addressed This Visit       Cardiovascular and Mediastinum   Essential hypertension, benign     Other   Mixed hyperlipidemia - Primary   Other Visit Diagnoses       SOB (shortness of breath)         CHF (congestive heart failure), NYHA class I, chronic, diastolic (HCC)         CHF (congestive heart failure), NYHA class III, acute on chronic, diastolic (HCC)       doing well, feel tired, but on GDMT          Disposition:   Return in about 3 months (around 07/10/2024).    Total time spent: 35 minutes  Signed,  Debborah Fairly, MD  04/10/2024 9:31 AM    [facility]

## 2024-05-25 ENCOUNTER — Other Ambulatory Visit: Payer: Self-pay | Admitting: Cardiovascular Disease

## 2024-05-25 DIAGNOSIS — R609 Edema, unspecified: Secondary | ICD-10-CM

## 2024-05-28 ENCOUNTER — Ambulatory Visit: Payer: Medicare HMO | Admitting: Cardiology

## 2024-06-01 ENCOUNTER — Ambulatory Visit: Admitting: Cardiology

## 2024-06-29 ENCOUNTER — Observation Stay: Admitting: Certified Registered Nurse Anesthetist

## 2024-06-29 ENCOUNTER — Encounter
Admission: EM | Disposition: A | Discharge status: Home / Self Care | Payer: Self-pay | Source: Home / Self Care | Attending: Emergency Medicine

## 2024-06-29 ENCOUNTER — Other Ambulatory Visit: Payer: Self-pay

## 2024-06-29 ENCOUNTER — Observation Stay
Admission: EM | Admit: 2024-06-29 | Discharge: 2024-06-30 | Disposition: A | Discharge status: Home / Self Care | Attending: Surgery | Admitting: Surgery

## 2024-06-29 ENCOUNTER — Emergency Department

## 2024-06-29 DIAGNOSIS — I1310 Hypertensive heart and chronic kidney disease without heart failure, with stage 1 through stage 4 chronic kidney disease, or unspecified chronic kidney disease: Secondary | ICD-10-CM | POA: Diagnosis not present

## 2024-06-29 DIAGNOSIS — Z96641 Presence of right artificial hip joint: Secondary | ICD-10-CM | POA: Insufficient documentation

## 2024-06-29 DIAGNOSIS — K3580 Unspecified acute appendicitis: Principal | ICD-10-CM | POA: Diagnosis present

## 2024-06-29 DIAGNOSIS — N189 Chronic kidney disease, unspecified: Secondary | ICD-10-CM | POA: Diagnosis not present

## 2024-06-29 DIAGNOSIS — R1031 Right lower quadrant pain: Secondary | ICD-10-CM | POA: Diagnosis present

## 2024-06-29 DIAGNOSIS — R7309 Other abnormal glucose: Secondary | ICD-10-CM | POA: Insufficient documentation

## 2024-06-29 DIAGNOSIS — K5900 Constipation, unspecified: Secondary | ICD-10-CM | POA: Diagnosis not present

## 2024-06-29 DIAGNOSIS — R109 Unspecified abdominal pain: Secondary | ICD-10-CM | POA: Diagnosis not present

## 2024-06-29 DIAGNOSIS — E1122 Type 2 diabetes mellitus with diabetic chronic kidney disease: Secondary | ICD-10-CM | POA: Diagnosis not present

## 2024-06-29 DIAGNOSIS — I129 Hypertensive chronic kidney disease with stage 1 through stage 4 chronic kidney disease, or unspecified chronic kidney disease: Secondary | ICD-10-CM | POA: Diagnosis not present

## 2024-06-29 HISTORY — PX: LAPAROSCOPIC APPENDECTOMY: SHX408

## 2024-06-29 LAB — URINALYSIS, ROUTINE W REFLEX MICROSCOPIC
Bilirubin Urine: NEGATIVE
Glucose, UA: NEGATIVE mg/dL
Hgb urine dipstick: NEGATIVE
Ketones, ur: NEGATIVE mg/dL
Leukocytes,Ua: NEGATIVE
Nitrite: NEGATIVE
Protein, ur: NEGATIVE mg/dL
Specific Gravity, Urine: >1.046 — ABNORMAL HIGH (ref 1.005–1.030)
pH: 6 (ref 5.0–8.0)

## 2024-06-29 LAB — COMPREHENSIVE METABOLIC PANEL WITH GFR
ALT: 25 U/L (ref 0–44)
AST: 24 U/L (ref 15–41)
Albumin: 3.8 g/dL (ref 3.5–5.0)
Alkaline Phosphatase: 42 U/L (ref 38–126)
Anion gap: 11 (ref 5–15)
BUN: 15 mg/dL (ref 8–23)
CO2: 23 mmol/L (ref 22–32)
Calcium: 9.9 mg/dL (ref 8.9–10.3)
Chloride: 101 mmol/L (ref 98–111)
Creatinine, Ser: 0.7 mg/dL (ref 0.44–1.00)
GFR, Estimated: >60 mL/min (ref >60)
Glucose, Bld: 125 mg/dL — ABNORMAL HIGH (ref 70–99)
Potassium: 3.6 mmol/L (ref 3.5–5.1)
Sodium: 135 mmol/L (ref 135–145)
Total Bilirubin: 0.6 mg/dL (ref 0.0–1.2)
Total Protein: 7.4 g/dL (ref 6.5–8.1)

## 2024-06-29 LAB — GLUCOSE, CAPILLARY: Glucose-Capillary: 176 mg/dL — ABNORMAL HIGH (ref 70–99)

## 2024-06-29 LAB — CBC
HCT: 42.4 % (ref 36.0–46.0)
Hemoglobin: 14.5 g/dL (ref 12.0–15.0)
MCH: 30.8 pg (ref 26.0–34.0)
MCHC: 34.2 g/dL (ref 30.0–36.0)
MCV: 90 fL (ref 80.0–100.0)
Platelets: 210 K/uL (ref 150–400)
RBC: 4.71 MIL/uL (ref 3.87–5.11)
RDW: 13.4 % (ref 11.5–15.5)
WBC: 11.1 K/uL — ABNORMAL HIGH (ref 4.0–10.5)
nRBC: 0 % (ref 0.0–0.2)

## 2024-06-29 LAB — LIPASE, BLOOD: Lipase: 28 U/L (ref 11–51)

## 2024-06-29 SURGERY — APPENDECTOMY, LAPAROSCOPIC
Anesthesia: General

## 2024-06-29 MED ORDER — ROCURONIUM BROMIDE 10 MG/ML (PF) SYRINGE
PREFILLED_SYRINGE | INTRAVENOUS | Status: AC
  Filled 2024-06-29: qty 10

## 2024-06-29 MED ORDER — LIDOCAINE HCL (PF) 2 % IJ SOLN
INTRAMUSCULAR | Status: AC
  Filled 2024-06-29: qty 5

## 2024-06-29 MED ORDER — KETOROLAC TROMETHAMINE 30 MG/ML IJ SOLN
INTRAMUSCULAR | Status: DC | PRN
  Administered 2024-06-29: 30 mg via INTRAVENOUS

## 2024-06-29 MED ORDER — MORPHINE SULFATE (PF) 4 MG/ML IV SOLN
4.0000 mg | Freq: Once | INTRAVENOUS | Status: AC
  Administered 2024-06-29: 4 mg via INTRAVENOUS
  Filled 2024-06-29: qty 1

## 2024-06-29 MED ORDER — DEXAMETHASONE SODIUM PHOSPHATE 10 MG/ML IJ SOLN
INTRAMUSCULAR | Status: AC
  Filled 2024-06-29: qty 1

## 2024-06-29 MED ORDER — LACTATED RINGERS IV SOLN: INTRAVENOUS | Status: DC | PRN

## 2024-06-29 MED ORDER — ONDANSETRON HCL 4 MG/2ML IJ SOLN
INTRAMUSCULAR | Status: DC | PRN
  Administered 2024-06-29: 4 mg via INTRAVENOUS

## 2024-06-29 MED ORDER — FENTANYL CITRATE (PF) 100 MCG/2ML IJ SOLN
INTRAMUSCULAR | Status: DC | PRN
  Administered 2024-06-29 (×2): 50 ug via INTRAVENOUS

## 2024-06-29 MED ORDER — ACETAMINOPHEN 10 MG/ML IV SOLN
INTRAVENOUS | Status: AC
  Filled 2024-06-29: qty 100

## 2024-06-29 MED ORDER — ROCURONIUM BROMIDE 100 MG/10ML IV SOLN
INTRAVENOUS | Status: DC | PRN
  Administered 2024-06-29: 5 mg via INTRAVENOUS
  Administered 2024-06-29: 50 mg via INTRAVENOUS

## 2024-06-29 MED ORDER — FENTANYL CITRATE (PF) 100 MCG/2ML IJ SOLN: 25.0000 ug | INTRAMUSCULAR | Status: DC | PRN

## 2024-06-29 MED ORDER — ONDANSETRON HCL 4 MG/2ML IJ SOLN
4.0000 mg | Freq: Once | INTRAMUSCULAR | Status: AC
  Administered 2024-06-29: 4 mg via INTRAVENOUS
  Filled 2024-06-29: qty 2

## 2024-06-29 MED ORDER — OXYCODONE HCL 5 MG PO TABS: 5.0000 mg | ORAL_TABLET | Freq: Four times a day (QID) | ORAL | 0 refills | Status: DC | PRN

## 2024-06-29 MED ORDER — OXYCODONE HCL 5 MG PO TABS
5.0000 mg | ORAL_TABLET | Freq: Once | ORAL | Status: AC | PRN
  Administered 2024-06-29: 5 mg via ORAL

## 2024-06-29 MED ORDER — SODIUM CHLORIDE 0.9 % IV SOLN
2.0000 g | INTRAVENOUS | Status: DC
  Administered 2024-06-29: 2 g via INTRAVENOUS
  Filled 2024-06-29: qty 20

## 2024-06-29 MED ORDER — 0.9 % SODIUM CHLORIDE (POUR BTL) OPTIME
TOPICAL | Status: DC | PRN
  Administered 2024-06-29: 500 mL

## 2024-06-29 MED ORDER — OXYCODONE HCL 5 MG/5ML PO SOLN: 5.0000 mg | Freq: Once | ORAL | Status: AC | PRN

## 2024-06-29 MED ORDER — DEXAMETHASONE SODIUM PHOSPHATE 10 MG/ML IJ SOLN
INTRAMUSCULAR | Status: DC | PRN
Start: 2024-06-29 — End: 2024-06-29
  Administered 2024-06-29: 10 mg via INTRAVENOUS

## 2024-06-29 MED ORDER — LIDOCAINE-EPINEPHRINE (PF) 1 %-1:200000 IJ SOLN
INTRAMUSCULAR | Status: AC
  Filled 2024-06-29: qty 30

## 2024-06-29 MED ORDER — EPINEPHRINE PF 1 MG/ML IJ SOLN
INTRAMUSCULAR | Status: AC
  Filled 2024-06-29: qty 1

## 2024-06-29 MED ORDER — IOHEXOL 300 MG/ML  SOLN
100.0000 mL | Freq: Once | INTRAMUSCULAR | Status: AC | PRN
  Administered 2024-06-29: 100 mL via INTRAVENOUS

## 2024-06-29 MED ORDER — KETOROLAC TROMETHAMINE 30 MG/ML IJ SOLN
INTRAMUSCULAR | Status: AC
  Filled 2024-06-29: qty 1

## 2024-06-29 MED ORDER — BUPIVACAINE HCL (PF) 0.5 % IJ SOLN
INTRAMUSCULAR | Status: AC
  Filled 2024-06-29: qty 30

## 2024-06-29 MED ORDER — OXYCODONE HCL 5 MG PO TABS
ORAL_TABLET | ORAL | Status: AC
  Filled 2024-06-29: qty 1

## 2024-06-29 MED ORDER — PROPOFOL 10 MG/ML IV BOLUS
INTRAVENOUS | Status: AC
  Filled 2024-06-29: qty 20

## 2024-06-29 MED ORDER — ONDANSETRON HCL 4 MG/2ML IJ SOLN: 4.0000 mg | Freq: Four times a day (QID) | INTRAMUSCULAR | Status: DC | PRN

## 2024-06-29 MED ORDER — ONDANSETRON HCL 4 MG/2ML IJ SOLN
INTRAMUSCULAR | Status: AC
  Filled 2024-06-29: qty 2

## 2024-06-29 MED ORDER — ACETAMINOPHEN 10 MG/ML IV SOLN
INTRAVENOUS | Status: DC | PRN
  Administered 2024-06-29: 1000 mg via INTRAVENOUS

## 2024-06-29 MED ORDER — SUGAMMADEX SODIUM 200 MG/2ML IV SOLN
INTRAVENOUS | Status: DC | PRN
  Administered 2024-06-29: 200 mg via INTRAVENOUS

## 2024-06-29 MED ORDER — LIDOCAINE HCL (CARDIAC) PF 100 MG/5ML IV SOSY
PREFILLED_SYRINGE | INTRAVENOUS | Status: DC | PRN
  Administered 2024-06-29: 60 mg via INTRAVENOUS

## 2024-06-29 MED ORDER — BUPIVACAINE LIPOSOME 1.3 % IJ SUSP
INTRAMUSCULAR | Status: AC
  Filled 2024-06-29: qty 10

## 2024-06-29 MED ORDER — METRONIDAZOLE 500 MG/100ML IV SOLN
500.0000 mg | Freq: Two times a day (BID) | INTRAVENOUS | Status: DC
  Administered 2024-06-29: 500 mg via INTRAVENOUS
  Filled 2024-06-29: qty 100

## 2024-06-29 MED ORDER — FENTANYL CITRATE (PF) 100 MCG/2ML IJ SOLN
INTRAMUSCULAR | Status: AC
  Filled 2024-06-29: qty 2

## 2024-06-29 MED ORDER — FENTANYL CITRATE (PF) 100 MCG/2ML IJ SOLN
INTRAMUSCULAR | Status: AC
Start: 2024-06-29 — End: 2024-06-29
  Filled 2024-06-29: qty 2

## 2024-06-29 MED ORDER — PROPOFOL 10 MG/ML IV BOLUS
INTRAVENOUS | Status: DC | PRN
  Administered 2024-06-29: 120 mg via INTRAVENOUS

## 2024-06-29 MED ORDER — BUPIVACAINE-EPINEPHRINE 0.5% -1:200000 IJ SOLN
INTRAMUSCULAR | Status: DC | PRN
  Administered 2024-06-29: 30 mL

## 2024-06-29 SURGICAL SUPPLY — 67 items
ANCHOR TIS RET SYS 235ML (MISCELLANEOUS) ×1 IMPLANT
BAG PRESSURE INF REUSE 1000 (BAG) IMPLANT
CABLE HIGH FREQUENCY MONO STRZ (ELECTRODE) ×2 IMPLANT
CLIP APPLIE ROT 10 11.4 M/L (STAPLE) IMPLANT
COVER TIP SHEARS 8 DVNC (MISCELLANEOUS) ×1 IMPLANT
COVER WAND RF STERILE (DRAPES) ×1 IMPLANT
CUTTER FLEX LINEAR 45M (STAPLE) IMPLANT
DERMABOND ADVANCED .7 DNX12 (GAUZE/BANDAGES/DRESSINGS) ×3 IMPLANT
DRAIN CHANNEL JP 15F RND 3/16 (MISCELLANEOUS) IMPLANT
DRAIN CHANNEL JP 19F RND 3/16 (MISCELLANEOUS) IMPLANT
DRAPE ARM DVNC X/XI (DISPOSABLE) ×3 IMPLANT
DRAPE COLUMN DVNC XI (DISPOSABLE) ×1 IMPLANT
ELECTRODE REM PT RTRN 9FT ADLT (ELECTROSURGICAL) ×3 IMPLANT
EVACUATOR DRAINAGE 7X20 100CC (MISCELLANEOUS) IMPLANT
EVACUATOR SILICONE 100CC (DRAIN) IMPLANT
FORCEPS BPLR FENES DVNC XI (FORCEP) ×1 IMPLANT
GLOVE BIOGEL PI IND STRL 7.0 (GLOVE) ×2 IMPLANT
GLOVE BIOGEL PI IND STRL 7.5 (GLOVE) ×2 IMPLANT
GLOVE SURG SYN 6.5 PF PI (GLOVE) ×8 IMPLANT
GLOVE SURG SYN 7.0 PF PI (GLOVE) ×2 IMPLANT
GOWN STRL REUS W/ TWL LRG LVL3 (GOWN DISPOSABLE) ×8 IMPLANT
GRASPER SUT TROCAR 14GX15 (MISCELLANEOUS) ×2 IMPLANT
IRRIGATION STRYKERFLOW (MISCELLANEOUS) IMPLANT
IRRIGATOR SUCT 8 DISP DVNC XI (IRRIGATION / IRRIGATOR) IMPLANT
IV NS 1000ML BAXH (IV SOLUTION) IMPLANT
KIT PINK PAD W/HEAD ARM REST (MISCELLANEOUS) ×2 IMPLANT
KIT TURNOVER KIT A (KITS) ×3 IMPLANT
LABEL OR SOLS (LABEL) IMPLANT
MANIFOLD NEPTUNE II (INSTRUMENTS) ×3 IMPLANT
NDL HYPO 22X1.5 SAFETY MO (MISCELLANEOUS) ×2 IMPLANT
NDL INSUFFLATION 14GA 120MM (NEEDLE) ×2 IMPLANT
NEEDLE HYPO 22X1.5 SAFETY MO (MISCELLANEOUS) ×2 IMPLANT
NEEDLE INSUFFLATION 14GA 120MM (NEEDLE) ×4 IMPLANT
NS IRRIG 500ML POUR BTL (IV SOLUTION) ×2 IMPLANT
OBTURATOR OPTICALSTD 8 DVNC (TROCAR) ×1 IMPLANT
PACK LAP CHOLECYSTECTOMY (MISCELLANEOUS) ×3 IMPLANT
RELOAD 45 VASCULAR/THIN (ENDOMECHANICALS) ×4 IMPLANT
RELOAD STAPLE 45 2.5 WHT DVNC (STAPLE) IMPLANT
RELOAD STAPLE 45 2.5 WHT GRN (ENDOMECHANICALS) ×2 IMPLANT
RELOAD STAPLE 45 3.5 BLU DVNC (STAPLE) IMPLANT
RELOAD STAPLE 45 3.5 BLU ETS (ENDOMECHANICALS) ×1 IMPLANT
RELOAD STAPLE TA45 3.5 REG BLU (ENDOMECHANICALS) ×2 IMPLANT
RELOAD STAPLER 2.5X45 WHT DVNC (STAPLE) IMPLANT
RELOAD STAPLER 3.5X45 BLU DVNC (STAPLE) IMPLANT
SCISSORS LAP 5X35 DISP (ENDOMECHANICALS) ×1 IMPLANT
SCISSORS METZENBAUM CVD 33 (INSTRUMENTS) IMPLANT
SCISSORS MNPLR CVD DVNC XI (INSTRUMENTS) ×1 IMPLANT
SEAL UNIV 5-12 XI (MISCELLANEOUS) ×3 IMPLANT
SEALER VESSEL EXT DVNC XI (MISCELLANEOUS) IMPLANT
SET TUBE SMOKE EVAC HIGH FLOW (TUBING) ×3 IMPLANT
SLEEVE Z-THREAD 5X100MM (TROCAR) ×2 IMPLANT
SOLUTION ELECTROSURG ANTI STCK (MISCELLANEOUS) ×1 IMPLANT
STAPLER 45 SUREFORM DVNC (STAPLE) IMPLANT
SUT ETHILON 2 0 FS 18 (SUTURE) IMPLANT
SUT MNCRL AB 4-0 PS2 18 (SUTURE) ×4 IMPLANT
SUT VIC AB 2-0 SH 27XBRD (SUTURE) ×1 IMPLANT
SUT VIC AB 3-0 SH 27X BRD (SUTURE) IMPLANT
SUT VICRYL 0 UR6 27IN ABS (SUTURE) ×3 IMPLANT
SUTURE EHLN 3-0 FS-10 30 BLK (SUTURE) IMPLANT
SYR 30ML LL (SYRINGE) ×1 IMPLANT
SYSTEM BAG RETRIEVAL 10MM (BASKET) ×2 IMPLANT
SYSTEM WECK SHIELD CLOSURE (TROCAR) IMPLANT
TRAP FLUID SMOKE EVACUATOR (MISCELLANEOUS) ×3 IMPLANT
TRAY FOLEY MTR SLVR 16FR STAT (SET/KITS/TRAYS/PACK) ×4 IMPLANT
TROCAR Z-THREAD FIOS 12X100MM (TROCAR) ×2 IMPLANT
TROCAR Z-THREAD FIOS 5X100MM (TROCAR) ×2 IMPLANT
WATER STERILE IRR 500ML POUR (IV SOLUTION) ×3 IMPLANT

## 2024-06-29 NOTE — Anesthesia Preprocedure Evaluation (Signed)
 Anesthesia Evaluation  Patient identified by MRN, date of birth, ID band Patient awake    Reviewed: Allergy & Precautions, NPO status , Patient's Chart, lab work & pertinent test results  Airway Mallampati: III  TM Distance: >3 FB Neck ROM: full    Dental  (+) Missing   Pulmonary neg pulmonary ROS, neg shortness of breath   Pulmonary exam normal        Cardiovascular hypertension, (-) angina (-) Past MI and (-) DOE Normal cardiovascular exam     Neuro/Psych negative neurological ROS  negative psych ROS   GI/Hepatic negative GI ROS, Neg liver ROS,,,  Endo/Other  diabetes    Renal/GU Renal disease     Musculoskeletal   Abdominal   Peds  Hematology negative hematology ROS (+)   Anesthesia Other Findings Past Medical History: No date: Arthritis     Comment:  Osteoarthritis and Rheumatoid  No date: Cancer (HCC)     Comment:  Lung  No date: Chronic kidney disease No date: Heart disease No date: Hyperlipidemia No date: Hypertension  Past Surgical History: 02/07/2018: COLONOSCOPY WITH PROPOFOL ; N/A     Comment:  Procedure: COLONOSCOPY WITH PROPOFOL ;  Surgeon:               Gaylyn Gladis PENNER, MD;  Location: [facility] ENDOSCOPY;                Service: Endoscopy;  Laterality: N/A; No date: fatty tumor removed 09/10/2015: TOTAL HIP ARTHROPLASTY; Right     Comment:  Procedure: TOTAL HIP ARTHROPLASTY;  Surgeon: Kayla Pinal, MD;  Location: [facility] ORS;  Service: Orthopedics;                Laterality: Right; No date: TUBAL LIGATION  BMI    Body Mass Index: 33.12 kg/m      Reproductive/Obstetrics negative OB ROS                              Anesthesia Physical Anesthesia Plan  ASA: 3  Anesthesia Plan: General ETT   Post-op Pain Management:    Induction: Intravenous  PONV Risk Score and Plan: Ondansetron , Dexamethasone , Midazolam  and Treatment may vary due to age or  medical condition  Airway Management Planned: Oral ETT  Additional Equipment:   Intra-op Plan:   Post-operative Plan: Extubation in OR  Informed Consent: I have reviewed the patients History and Physical, chart, labs and discussed the procedure including the risks, benefits and alternatives for the proposed anesthesia with the patient or authorized representative who has indicated his/her understanding and acceptance.     Dental Advisory Given  Plan Discussed with: Anesthesiologist, CRNA and Surgeon  Anesthesia Plan Comments: (Patient consented for risks of anesthesia including but not limited to:  - adverse reactions to medications - damage to eyes, teeth, lips or other oral mucosa - nerve damage due to positioning  - sore throat or hoarseness - Damage to heart, brain, nerves, lungs, other parts of body or loss of life  Patient voiced understanding and assent.)        Anesthesia Quick Evaluation

## 2024-06-29 NOTE — Anesthesia Procedure Notes (Signed)
 Procedure Name: Intubation Date/Time: 06/29/2024 3:21 PM  Performed by: Erie Jyl MATSU, CRNAPre-anesthesia Checklist: Patient identified, Patient being monitored, Timeout performed, Emergency Drugs available and Suction available Patient Re-evaluated:Patient Re-evaluated prior to induction Oxygen Delivery Method: Circle system utilized Preoxygenation: Pre-oxygenation with 100% oxygen Induction Type: IV induction Ventilation: Mask ventilation without difficulty Laryngoscope Size: 3 and McGrath Grade View: Grade I Tube type: Oral Tube size: 6.5 mm Number of attempts: 1 Airway Equipment and Method: Stylet Placement Confirmation: ETT inserted through vocal cords under direct vision, positive ETCO2 and breath sounds checked- equal and bilateral Secured at: 20 cm Tube secured with: Tape Dental Injury: Teeth and Oropharynx as per pre-operative assessment

## 2024-06-29 NOTE — ED Notes (Signed)
 Patient in CT

## 2024-06-29 NOTE — Op Note (Signed)
 Laparoscopic Appendectomy6  Pre-operative Diagnosis: Acute Appendicitis  Post-operative Diagnosis: Same  Procedure: Laparoscopic Appendectomy  Surgeon: Jayson Endow, MD  Anesthesia: Gen. with endotracheal tube  Assistant:None   Findings: Acute appendicitis   Estimated Blood Loss: 10ccs         Drains: None         Specimens: Appendix Complications: none   Procedure Details  After informed consent was obtained the patient was brought to the operating room placed supine on the operating room table.  General endotracheal anesthesia was then induced and her abdomen was then prepped and draped in the usual sterile fashion after placement of an OG tube and Foley catheter.  A surgical timeout was then called identifying correct patient, site, side and procedure.  In the left upper quadrant at Palmer's point Incision was made and a Veress needle was inserted into the abdomen using standard drop technique.  Pneumoperitoneum was then established.  A 5 mm infraumbilical incision was made and using an Optiview trocar a 5 mm port was placed under direct visualization.  There was noted to be no injury at the site of the Veress needle insertion site.  An additional 12 mm left lower quadrant 5 mm suprapubic ports were placed under direct visualization.  The patient was positioned with head down.  The small bowel was swept out of the right lower quadrant revealing the appendix that was loosely adhered to the abdominal sidewall.  This was swept off of the abdominal swallowed wall bluntly.  The mesoappendix was grasped and the appendix appeared to be inflamed but there was no evidence of perforation.  A window was made at the base of the appendix and the appendix was stapled off with a 45 mm Endo GIA blue load stapler.  The mesoappendix was then taken with 2 firings of the 45 mm Endo GIA vascular tan load stapler.  I did elect to oversew the staple lines as there was a small amount of bleeding after clip  placement over the staple line.  Using a 2-0 Vicryl the staple line was oversewed in a Lembert fashion.  The right lower quadrant was suctioned out of any blood and hemostasis was obtained.  The appendix was then placed in an Endo Catch bag.  It was removed through the 12 mm port incision.  The fascia of the 12 mm port was closed with 0 Vicryl on a suture needle passer.  The abdomen was then allowed to be desufflated.  The skin was then anesthetized with liposomal bupivacaine  and Marcaine  solution.  The skin was closed with 4-0 Monocryl and dressed with glue.  The patient was awoken and transferred to PACU in good condition.  Prior to termination of the procedure all sponge instrument counts were correct x 2.

## 2024-06-29 NOTE — ED Triage Notes (Signed)
 Pt c/o lower abdominal pain and nausea starting last night and constipation xa couple days.  Pain score 10/10.  Pt reports frequent issues w/ constipation.

## 2024-06-29 NOTE — Transfer of Care (Signed)
 Immediate Anesthesia Transfer of Care Note  Patient: Cristina Roberts  Procedure(s) Performed: APPENDECTOMY, LAPAROSCOPIC  Patient Location: PACU  Anesthesia Type:General  Level of Consciousness: drowsy and patient cooperative  Airway & Oxygen Therapy: Patient Spontanous Breathing and Patient connected to face mask oxygen  Post-op Assessment: Report given to RN and Post -op Vital signs reviewed and stable  Post vital signs: Reviewed and stable  Last Vitals:  Vitals Value Taken Time  BP 127/59 06/29/24 16:51  Temp    Pulse 94 06/29/24 16:51  Resp 14 06/29/24 16:51  SpO2 100 % 06/29/24 16:51  Vitals shown include unfiled device data.  Last Pain:  Vitals:   06/29/24 1247  TempSrc: Temporal  PainSc: 0-No pain         Complications: No notable events documented.

## 2024-06-29 NOTE — ED Provider Notes (Signed)
 [facility] Provider Note    Event Date/Time   First MD Initiated Contact with Patient 06/29/24 0825     (approximate)   History   Abdominal Pain and Nausea   HPI  Cristina Roberts is a 78 year old female with history of HTN, CKD presenting to the emergency department for evaluation of abdominal pain.  Last night, patient had onset of lower abdominal pain with associated nausea.  Last bowel movement was a few days ago, no straining, reports that it is not atypical for her to go few days without bowel movement.  Denies history of prior abdominal surgeries, tubal ligation noted documented in her chart.    Physical Exam   Triage Vital Signs: ED Triage Vitals  Encounter Vitals Group     BP 06/29/24 0810 (!) 152/61     Girls Systolic BP Percentile --      Girls Diastolic BP Percentile --      Boys Systolic BP Percentile --      Boys Diastolic BP Percentile --      Pulse Rate 06/29/24 0810 94     Resp 06/29/24 0810 20     Temp 06/29/24 0810 98.9 F (37.2 C)     Temp Source 06/29/24 0810 Oral     SpO2 06/29/24 0810 98 %     Weight 06/29/24 0811 199 lb (90.3 kg)     Height 06/29/24 0811 5' 5 (1.651 m)     Head Circumference --      Peak Flow --      Pain Score 06/29/24 0811 10     Pain Loc --      Pain Education --      Exclude from Growth Chart --     Most recent vital signs: Vitals:   06/29/24 1215 06/29/24 1247  BP: 139/70 (!) 127/57  Pulse: 93 86  Resp: 18 17  Temp:  98.7 F (37.1 C)  SpO2: 100% 97%     General: Awake, interactive  CV:  Regular rate, good peripheral perfusion.  Resp:  Unlabored respirations, lungs clear to auscultation Abd:  Nondistended, soft, tender to palpation over the lower abdomen without rebound or guarding Neuro:  Symmetric facial movement, fluid speech   ED Results / Procedures / Treatments   Labs (all labs ordered are listed, but only abnormal results are displayed) Labs Reviewed   COMPREHENSIVE METABOLIC PANEL WITH GFR - Abnormal; Notable for the following components:      Result Value   Glucose, Bld 125 (*)    All other components within normal limits  CBC - Abnormal; Notable for the following components:   WBC 11.1 (*)    All other components within normal limits  URINALYSIS, ROUTINE W REFLEX MICROSCOPIC - Abnormal; Notable for the following components:   Color, Urine YELLOW (*)    APPearance CLEAR (*)    Specific Gravity, Urine >1.046 (*)    All other components within normal limits  LIPASE, BLOOD     EKG EKG independently reviewed and interpreted by myself demonstrates:    RADIOLOGY Imaging independently reviewed and interpreted by myself demonstrates:  CT demonstrates findings of acute appendicitis without perforation or abscess  Formal Radiology Read:  CT ABDOMEN PELVIS W CONTRAST Result Date: 06/29/2024 CLINICAL DATA:  Acute lower abdominal pain, constipation. EXAM: CT ABDOMEN AND PELVIS WITH CONTRAST TECHNIQUE: Multidetector CT imaging of the abdomen and pelvis was performed using the standard protocol following bolus administration of intravenous contrast. RADIATION DOSE REDUCTION:  This exam was performed according to the departmental dose-optimization program which includes automated exposure control, adjustment of the mA and/or kV according to patient size and/or use of iterative reconstruction technique. CONTRAST:  OMNIPAQUE  IOHEXOL  300 MG/ML  SOLN COMPARISON:  None Available. FINDINGS: Lower chest: No acute abnormality. Hepatobiliary: No focal liver abnormality is seen. No gallstones, gallbladder wall thickening, or biliary dilatation. Pancreas: Unremarkable. No pancreatic ductal dilatation or surrounding inflammatory changes. Spleen: Normal in size without focal abnormality. Adrenals/Urinary Tract: Adrenal glands are unremarkable. Kidneys are normal, without renal calculi, focal lesion, or hydronephrosis. Bladder is unremarkable. Stomach/Bowel:  The stomach is unremarkable. There is no evidence of bowel obstruction. The appendix is dilated and fluid-filled with a maximum measured diameter of 10 mm with minimal surrounding inflammatory changes. This is concerning for probable acute appendicitis. Vascular/Lymphatic: Aortic atherosclerosis. No enlarged abdominal or pelvic lymph nodes. Reproductive: Uterus and bilateral adnexa are unremarkable. Other: No ascites or hernia is noted. Musculoskeletal: Status post right total hip arthroplasty. No acute osseous abnormality is noted. IMPRESSION: Findings consistent with probable acute appendicitis. No definite abscess formation. Aortic Atherosclerosis (ICD10-I70.0). Electronically Signed   By: Lynwood Landy Raddle M.D.   On: 06/29/2024 10:10    PROCEDURES:  Critical Care performed: No  Procedures   MEDICATIONS ORDERED IN ED: Medications  cefTRIAXone  (ROCEPHIN ) 2 g in sodium chloride  0.9 % 100 mL IVPB (has no administration in time range)    And  metroNIDAZOLE  (FLAGYL ) IVPB 500 mg (has no administration in time range)  morphine  (PF) 4 MG/ML injection 4 mg (4 mg Intravenous Given 06/29/24 0900)  ondansetron  (ZOFRAN ) injection 4 mg (4 mg Intravenous Given 06/29/24 0900)  iohexol  (OMNIPAQUE ) 300 MG/ML solution 100 mL (100 mLs Intravenous Contrast Given 06/29/24 0922)  morphine  (PF) 4 MG/ML injection 4 mg (4 mg Intravenous Given 06/29/24 1211)     IMPRESSION / MDM / ASSESSMENT AND PLAN / ED COURSE  I reviewed the triage vital signs and the nursing notes.  Differential diagnosis includes, but is not limited to, appendicitis, colitis, diverticulitis, bowel obstruction, other acute intra-abdominal process  Patient's presentation is most consistent with acute presentation with potential threat to life or bodily function.  78 year old female presenting to the emergency department for evaluation of abdominal pain, nausea.  Stable vitals on presentation.  Labs with leukocytosis with WC of 11.1.  CMP without  an apparent derangement.  Normal lipase.  Urine without evidence of infection.  CT does demonstrate findings concerning for acute appendicitis.  Case reviewed with Dr. Charletta with general surgery.  He will admit the patient with plans for operative intervention today.      FINAL CLINICAL IMPRESSION(S) / ED DIAGNOSES   Final diagnoses:  Acute appendicitis, unspecified acute appendicitis type     Rx / DC Orders   ED Discharge Orders     None        Note:  This document was prepared using Dragon voice recognition software and may include unintentional dictation errors.   Levander Slate, MD 06/29/24 1302

## 2024-06-29 NOTE — H&P (Signed)
 Patient ID: Cristina Roberts, female   DOB: [birth year], 78 y.o.   MRN: [id number] CC: Right lower quadrant pain History of Present Illness Cristina Roberts is a 78 y.o. female with past medical history significant for hypertension and chronic kidney disease who presents in consultation for right lower quadrant pain.  Patient reports that the right lower quadrant pain started last night.  She said at first she thought she was constipated but the pain persisted.  The pain did not radiate.  She denies any nausea or vomiting.  In the hospital she underwent labs that showed a leukocytosis of 11,000 and a CT scan that was concerning for acute appendicitis..  Past Medical History Past Medical History:  Diagnosis Date   Arthritis    Osteoarthritis and Rheumatoid    Cancer (HCC)    Lung    Chronic kidney disease    Heart disease    Hyperlipidemia    Hypertension        Past Surgical History:  Procedure Laterality Date   COLONOSCOPY WITH PROPOFOL  N/A 02/07/2018   Procedure: COLONOSCOPY WITH PROPOFOL ;  Surgeon: Gaylyn Gladis PENNER, MD;  Location: [facility] ENDOSCOPY;  Service: Endoscopy;  Laterality: N/A;   fatty tumor removed     TOTAL HIP ARTHROPLASTY Right 09/10/2015   Procedure: TOTAL HIP ARTHROPLASTY;  Surgeon: Kayla Pinal, MD;  Location: [facility] ORS;  Service: Orthopedics;  Laterality: Right;   TUBAL LIGATION      No Known Allergies  Current Facility-Administered Medications  Medication Dose Route Frequency Provider Last Rate Last Admin   cefTRIAXone  (ROCEPHIN ) 2 g in sodium chloride  0.9 % 100 mL IVPB  2 g Intravenous Q24H Sakai, Isami, DO 200 mL/hr at 06/29/24 1444 2 g at 06/29/24 1444   And   metroNIDAZOLE  (FLAGYL ) IVPB 500 mg  500 mg Intravenous Q12H Sakai, Isami, DO        Family History Family History  Problem Relation Age of Onset   Breast cancer Neg Hx        Social History Social History   Tobacco Use   Smoking status: Never   Smokeless tobacco: Never   Vaping Use   Vaping status: Never Used  Substance Use Topics   Alcohol use: No    Alcohol/week: 0.0 standard drinks of alcohol   Drug use: No        ROS Full ROS of systems performed and is otherwise negative there than what is stated in the HPI  Physical Exam Blood pressure (!) 127/57, pulse 86, temperature 98.7 F (37.1 C), temperature source Temporal, resp. rate 17, height 5' 5 (1.651 m), weight 90.3 kg, SpO2 97%.  Alert and oriented x 3, normal work of breathing on room air, regular rate and rhythm, abdomen soft, the obese, tender to palpation in right lower quadrant without rebound tenderness or guarding, no tenderness in the left lower quadrant.  Data Reviewed Labs reviewed and significant for leukocytosis of 11,000.  Otherwise BMP was within normal limits.  CT independently reviewed and it appears that there is some inflammation in the right lower quadrant around the appendix I do not see any fluid collections or concern for perforated appendicitis  I have personally reviewed the patient's imaging and medical records.    Assessment    Devenney 68-year-old female with past medical history significant for hypertension who presents in consultation for right lower quadrant pain.  Clinical history, labs and imaging findings consistent with acute appendicitis.  Plan    I discussed the plan of  laparoscopic appendectomy.  We discussed the risk, benefits alternatives of the procedure including risk of infection, bleeding, damage to surrounding structures, staple leak, need for open procedure, perforation of the appendix and deep organ space abscess.  She understands these risks and wishes to proceed with surgery.    Jayson MALVA Endow 06/29/2024, 2:58 PM

## 2024-06-29 NOTE — ED Notes (Signed)
 Patient ambulated to restroom using cane and had no difficulties.

## 2024-06-29 NOTE — ED Notes (Signed)
 Patient is aware that urine sample is needed; unable to provide at this time.

## 2024-06-30 MED ORDER — AMLODIPINE BESYLATE 5 MG PO TABS
5.0000 mg | ORAL_TABLET | Freq: Every day | ORAL | Status: DC
  Administered 2024-06-30: 5 mg via ORAL
  Filled 2024-06-30: qty 1

## 2024-06-30 MED ORDER — LISINOPRIL-HYDROCHLOROTHIAZIDE 20-25 MG PO TABS: 1.0000 | ORAL_TABLET | Freq: Every day | ORAL | Status: DC

## 2024-06-30 MED ORDER — ACETAMINOPHEN 325 MG PO TABS
650.0000 mg | ORAL_TABLET | Freq: Three times a day (TID) | ORAL | Status: DC | PRN
Start: 2024-06-30 — End: 2024-06-30

## 2024-06-30 MED ORDER — FUROSEMIDE 20 MG PO TABS
20.0000 mg | ORAL_TABLET | Freq: Every day | ORAL | Status: DC
  Administered 2024-06-30: 20 mg via ORAL
  Filled 2024-06-30: qty 1

## 2024-06-30 MED ORDER — ONDANSETRON 4 MG PO TBDP
4.0000 mg | ORAL_TABLET | Freq: Four times a day (QID) | ORAL | Status: DC | PRN
Start: 2024-06-30 — End: 2024-06-30

## 2024-06-30 MED ORDER — TRAMADOL HCL 50 MG PO TABS
50.0000 mg | ORAL_TABLET | Freq: Four times a day (QID) | ORAL | Status: DC | PRN
  Administered 2024-06-30: 50 mg via ORAL
  Filled 2024-06-30: qty 1

## 2024-06-30 MED ORDER — MORPHINE SULFATE (PF) 2 MG/ML IV SOLN: 2.0000 mg | INTRAVENOUS | Status: DC | PRN

## 2024-06-30 MED ORDER — ONDANSETRON HCL 4 MG/2ML IJ SOLN: 4.0000 mg | Freq: Four times a day (QID) | INTRAMUSCULAR | Status: DC | PRN

## 2024-06-30 MED ORDER — DOCUSATE SODIUM 100 MG PO CAPS
100.0000 mg | ORAL_CAPSULE | Freq: Two times a day (BID) | ORAL | Status: DC | PRN
Start: 2024-06-30 — End: 2024-06-30

## 2024-06-30 MED ORDER — HYDROCHLOROTHIAZIDE 25 MG PO TABS
25.0000 mg | ORAL_TABLET | Freq: Every day | ORAL | Status: DC
  Administered 2024-06-30: 25 mg via ORAL
  Filled 2024-06-30: qty 1

## 2024-06-30 MED ORDER — SPIRONOLACTONE 25 MG PO TABS
25.0000 mg | ORAL_TABLET | Freq: Every day | ORAL | Status: DC
  Administered 2024-06-30: 25 mg via ORAL
  Filled 2024-06-30: qty 1

## 2024-06-30 MED ORDER — LISINOPRIL 20 MG PO TABS
20.0000 mg | ORAL_TABLET | Freq: Every day | ORAL | Status: DC
  Administered 2024-06-30: 20 mg via ORAL
  Filled 2024-06-30: qty 1

## 2024-06-30 MED ORDER — HYDROCODONE-ACETAMINOPHEN 5-325 MG PO TABS: 1.0000 | ORAL_TABLET | Freq: Three times a day (TID) | ORAL | Status: DC | PRN

## 2024-06-30 NOTE — Care Management Obs Status (Signed)
 MEDICARE OBSERVATION STATUS NOTIFICATION   Patient Details  Name: Cristina Roberts MRN: [id number] Date of Birth: June 01, 1946   Medicare Observation Status Notification Given:  Yes    [name] 06/30/2024, 12:05 PM

## 2024-06-30 NOTE — Discharge Summary (Signed)
 Physician Discharge Summary  Patient ID: Cristina Roberts MRN: [id number] DOB/AGE: [birth year] 78 y.o.  Admit date: 06/29/2024 Discharge date: 06/30/2024  Admission Diagnoses: Acute appendicitis  Discharge Diagnoses:  Same as above  Discharged Condition: good  Hospital Course: admitted for above.  Initially scheduled to undergo surgery with me but due to emergent case, Dr. Jayson Endow kind enough to take over case and proceeded with her surgery.  Please see his op note for further details.  Post op, recovered as expected.  At time of d/c, tolerating diet and pain controlled  Consults: None  Discharge Exam: Blood pressure 124/61, pulse 62, temperature 98.6 F (37 C), temperature source Oral, resp. rate 18, height 5' 5 (1.651 m), weight 90.3 kg, SpO2 96%. General appearance: alert, cooperative, and no distress GI: Soft, no guarding, minimal tenderness to palpation right lower quadrant and around incisions, which are clean dry and intact  Disposition:  Discharge disposition: 01-Home or Self Care       Discharge Instructions     Call MD for:  difficulty breathing, headache or visual disturbances   Complete by: As directed    Call MD for:  extreme fatigue   Complete by: As directed    Call MD for:  hives   Complete by: As directed    Call MD for:  persistant dizziness or light-headedness   Complete by: As directed    Call MD for:  persistant nausea and vomiting   Complete by: As directed    Call MD for:  redness, tenderness, or signs of infection (pain, swelling, redness, odor or green/yellow discharge around incision site)   Complete by: As directed    Call MD for:  severe uncontrolled pain   Complete by: As directed    Call MD for:  temperature >100.4   Complete by: As directed    Diet - low sodium heart healthy   Complete by: As directed    Discharge instructions   Complete by: As directed    You have surgical glue over incisions.  This will peel off over  the next 2 to 4 weeks.  You may shower on postoperative day 1.  Please at the warm water run over your incisions and pat dry.  Please do not lift anything heavier than 10 pounds for 4 weeks.  Please do not submerge the wounds in water for 2 weeks.  You may take Tylenol  and ibuprofen as needed for pain.  You may place ice over your incisions to help with swelling and pain.  You should follow-up in our office in 2 weeks   Increase activity slowly   Complete by: As directed       Allergies as of 06/30/2024   No Known Allergies      Medication List     TAKE these medications    amLODipine  5 MG tablet Commonly known as: NORVASC  Take 1 tablet (5 mg total) by mouth daily.   furosemide  20 MG tablet Commonly known as: Lasix  Take 1 tablet (20 mg total) by mouth daily.   lisinopril -hydrochlorothiazide  20-25 MG tablet Commonly known as: ZESTORETIC  TAKE 1 TABLET BY MOUTH EVERY DAY   oxyCODONE  5 MG immediate release tablet Commonly known as: Oxy IR/ROXICODONE  Take 1 tablet (5 mg total) by mouth every 6 (six) hours as needed for severe pain (pain score 7-10).   spironolactone  25 MG tablet Commonly known as: ALDACTONE  TAKE 1 TABLET BY MOUTH EVERY DAY        Follow-up Information  Marinda Jayson KIDD, MD Follow up in 2 week(s).   Specialty: General Surgery Contact information: [address] [place] [address] [phone]                  Total time spent arranging discharge was >71min. Signed: Henriette Pierre 06/30/2024, 11:43 AM

## 2024-06-30 NOTE — Progress Notes (Signed)
 Pt has DC order. AVS was given and explained to pt, all questions were answered. Son's work note was given to pt.  Sister will provide transportation.

## 2024-06-30 NOTE — Plan of Care (Signed)

## 2024-07-01 ENCOUNTER — Encounter: Payer: Self-pay | Admitting: Surgery

## 2024-07-01 ENCOUNTER — Other Ambulatory Visit: Payer: Self-pay | Admitting: Surgery

## 2024-07-01 MED ORDER — OXYCODONE HCL 5 MG PO TABS: 5.0000 mg | ORAL_TABLET | Freq: Four times a day (QID) | ORAL | 0 refills | Status: DC | PRN

## 2024-07-02 ENCOUNTER — Encounter: Payer: Self-pay | Admitting: General Surgery

## 2024-07-03 LAB — SURGICAL PATHOLOGY

## 2024-07-09 NOTE — Anesthesia Postprocedure Evaluation (Signed)
 Anesthesia Post Note  Patient: Cristina Roberts Media planner  Procedure(s) Performed: APPENDECTOMY, LAPAROSCOPIC  Patient location during evaluation: PACU Anesthesia Type: General Level of consciousness: awake and alert Pain management: pain level controlled Vital Signs Assessment: post-procedure vital signs reviewed and stable Respiratory status: spontaneous breathing, nonlabored ventilation and respiratory function stable Cardiovascular status: blood pressure returned to baseline and stable Postop Assessment: no apparent nausea or vomiting Anesthetic complications: no   No notable events documented.   Last Vitals:  Vitals:   06/30/24 0352 06/30/24 0810  BP: 116/60 124/61  Pulse: 62 62  Resp: 20 18  Temp: 36.5 C 37 C  SpO2: 99% 96%    Last Pain:  Vitals:   06/30/24 1131  TempSrc:   PainSc: 3                  Fairy POUR Dashiell Franchino

## 2024-07-12 ENCOUNTER — Ambulatory Visit: Admitting: Cardiovascular Disease

## 2024-07-12 ENCOUNTER — Encounter: Payer: Self-pay | Admitting: Cardiovascular Disease

## 2024-07-12 VITALS — BP 134/66 | HR 78 | Ht 65.0 in | Wt 193.0 lb

## 2024-07-12 DIAGNOSIS — I10 Essential (primary) hypertension: Secondary | ICD-10-CM

## 2024-07-12 DIAGNOSIS — E782 Mixed hyperlipidemia: Secondary | ICD-10-CM

## 2024-07-12 DIAGNOSIS — R0602 Shortness of breath: Secondary | ICD-10-CM | POA: Diagnosis not present

## 2024-07-12 DIAGNOSIS — I5032 Chronic diastolic (congestive) heart failure: Secondary | ICD-10-CM

## 2024-07-12 NOTE — Progress Notes (Signed)
 Cardiology Office Note   Date:  07/12/2024   ID:  Cristina Roberts, DOB 1946/10/19, MRN [id number]  PCP:  Patient, No Pcp Per  Cardiologist:  Denyse Bathe, MD      History of Present Illness: Cristina Roberts is a 78 y.o. female who presents for  Chief Complaint  Patient presents with   Follow-up    3 months follow up    Doing fine.      Past Medical History:  Diagnosis Date   Arthritis    Osteoarthritis and Rheumatoid    Cancer (HCC)    Lung    Chronic kidney disease    Heart disease    Hyperlipidemia    Hypertension      Past Surgical History:  Procedure Laterality Date   COLONOSCOPY WITH PROPOFOL  N/A 02/07/2018   Procedure: COLONOSCOPY WITH PROPOFOL ;  Surgeon: Gaylyn Gladis PENNER, MD;  Location: [facility] ENDOSCOPY;  Service: Endoscopy;  Laterality: N/A;   fatty tumor removed     LAPAROSCOPIC APPENDECTOMY  06/29/2024   Procedure: APPENDECTOMY, LAPAROSCOPIC;  Surgeon: Marinda Jayson KIDD, MD;  Location: [facility] ORS;  Service: General;;   TOTAL HIP ARTHROPLASTY Right 09/10/2015   Procedure: TOTAL HIP ARTHROPLASTY;  Surgeon: Kayla Pinal, MD;  Location: [facility] ORS;  Service: Orthopedics;  Laterality: Right;   TUBAL LIGATION       Current Outpatient Medications  Medication Sig Dispense Refill   amLODipine  (NORVASC ) 5 MG tablet Take 1 tablet (5 mg total) by mouth daily. 90 tablet 3   furosemide  (LASIX ) 20 MG tablet Take 1 tablet (20 mg total) by mouth daily. 30 tablet 11   lisinopril -hydrochlorothiazide  (ZESTORETIC ) 20-25 MG tablet TAKE 1 TABLET BY MOUTH EVERY DAY 90 tablet 3   oxyCODONE  (OXY IR/ROXICODONE ) 5 MG immediate release tablet Take 1 tablet (5 mg total) by mouth every 6 (six) hours as needed for severe pain (pain score 7-10). 15 tablet 0   spironolactone  (ALDACTONE ) 25 MG tablet TAKE 1 TABLET BY MOUTH EVERY DAY 90 tablet 0   No current facility-administered medications for this visit.    Allergies:   Patient has no known allergies.     Social History:   reports that she has never smoked. She has never used smokeless tobacco. She reports that she does not drink alcohol and does not use drugs.   Family History:  family history is not on file.    ROS:     Review of Systems  Constitutional: Negative.   HENT: Negative.    Eyes: Negative.   Respiratory: Negative.    Gastrointestinal: Negative.   Genitourinary: Negative.   Musculoskeletal: Negative.   Skin: Negative.   Neurological: Negative.   Endo/Heme/Allergies: Negative.   Psychiatric/Behavioral: Negative.    All other systems reviewed and are negative.     All other systems are reviewed and negative.    PHYSICAL EXAM: VS:  BP 134/66   Pulse 78   Ht 5' 5 (1.651 m)   Wt 193 lb (87.5 kg)   SpO2 97%   BMI 32.12 kg/m  , BMI Body mass index is 32.12 kg/m. Last weight:  Wt Readings from Last 3 Encounters:  07/12/24 193 lb (87.5 kg)  06/29/24 199 lb (90.3 kg)  04/10/24 199 lb 3.2 oz (90.4 kg)     Physical Exam Constitutional:      Appearance: Normal appearance.  Cardiovascular:     Rate and Rhythm: Normal rate and regular rhythm.     Heart sounds: Normal heart sounds.  Pulmonary:     Effort: Pulmonary effort is normal.     Breath sounds: Normal breath sounds.  Musculoskeletal:     Right lower leg: No edema.     Left lower leg: No edema.  Neurological:     Mental Status: She is alert.       EKG:   Recent Labs: 01/19/2024: TSH 2.130 06/29/2024: ALT 25; BUN 15; Creatinine, Ser 0.70; Hemoglobin 14.5; Platelets 210; Potassium 3.6; Sodium 135    Lipid Panel    Component Value Date/Time   CHOL 133 01/19/2024 0924   TRIG 75 01/19/2024 0924   HDL 50 01/19/2024 0924   CHOLHDL 2.7 01/19/2024 0924   LDLCALC 68 01/19/2024 0924      Other studies Reviewed: Additional studies/ records that were reviewed today include:  Review of the above records demonstrates:       No data to display            ASSESSMENT AND PLAN:     ICD-10-CM   1. SOB (shortness of breath)  R06.02    Only on exertion.    2. Essential hypertension, benign  I10     3. Mixed hyperlipidemia  E78.2     4. CHF (congestive heart failure), NYHA class I, chronic, diastolic (HCC)  I50.32    Compensated, with GDMT       Problem List Items Addressed This Visit       Cardiovascular and Mediastinum   Essential hypertension, benign     Other   Mixed hyperlipidemia   Other Visit Diagnoses       SOB (shortness of breath)    -  Primary   Only on exertion.     CHF (congestive heart failure), NYHA class I, chronic, diastolic (HCC)       Compensated, with GDMT          Disposition:   Return in about 3 months (around 10/12/2024).    Total time spent: 30 minutes  Signed,  Denyse Bathe, MD  07/12/2024 9:45 AM    [facility]

## 2024-07-19 ENCOUNTER — Encounter: Payer: Self-pay | Admitting: General Surgery

## 2024-07-19 ENCOUNTER — Ambulatory Visit (INDEPENDENT_AMBULATORY_CARE_PROVIDER_SITE_OTHER): Admitting: General Surgery

## 2024-07-19 VITALS — BP 131/75 | HR 76 | Temp 98.6°F | Ht 64.0 in | Wt 195.0 lb

## 2024-07-19 DIAGNOSIS — K3580 Unspecified acute appendicitis: Secondary | ICD-10-CM

## 2024-07-19 DIAGNOSIS — K352 Acute appendicitis with generalized peritonitis, without perforation or abscess: Secondary | ICD-10-CM

## 2024-07-19 DIAGNOSIS — Z09 Encounter for follow-up examination after completed treatment for conditions other than malignant neoplasm: Secondary | ICD-10-CM

## 2024-07-19 NOTE — Progress Notes (Signed)
 Patient returns status post laparoscopic appendectomy.  She reports doing well.  She is tolerating a diet and having normal bowel function.  She does say that on Saturday she had 1 episode of some blood in her stool and says that it was very foul-smelling.  She said that however after that her bowel movements have gone back to regular.  Her incisions are healing well without any erythema or drainage from them.  Pathology consistent with acute appendicitis.  I discussed with her that if she continues to have bleeding per rectum to notify our office.  Okay to submerge wounds in water.  Continue Tylenol  and ibuprofen as needed for pain.  Can follow-up as needed

## 2024-07-19 NOTE — Patient Instructions (Signed)

## 2024-07-23 ENCOUNTER — Other Ambulatory Visit

## 2024-07-23 DIAGNOSIS — E782 Mixed hyperlipidemia: Secondary | ICD-10-CM

## 2024-07-23 DIAGNOSIS — Z1329 Encounter for screening for other suspected endocrine disorder: Secondary | ICD-10-CM | POA: Diagnosis not present

## 2024-07-23 DIAGNOSIS — R7303 Prediabetes: Secondary | ICD-10-CM

## 2024-07-23 DIAGNOSIS — I10 Essential (primary) hypertension: Secondary | ICD-10-CM

## 2024-07-24 ENCOUNTER — Ambulatory Visit: Payer: Self-pay | Admitting: Cardiology

## 2024-07-24 LAB — CMP14+EGFR
ALT: 22 IU/L (ref 0–32)
AST: 20 IU/L (ref 0–40)
Albumin: 4 g/dL (ref 3.8–4.8)
Alkaline Phosphatase: 55 IU/L (ref 44–121)
BUN/Creatinine Ratio: 20 (ref 12–28)
BUN: 17 mg/dL (ref 8–27)
Bilirubin Total: 0.4 mg/dL (ref 0.0–1.2)
CO2: 21 mmol/L (ref 20–29)
Calcium: 9.8 mg/dL (ref 8.7–10.3)
Chloride: 102 mmol/L (ref 96–106)
Creatinine, Ser: 0.84 mg/dL (ref 0.57–1.00)
Globulin, Total: 2.9 g/dL (ref 1.5–4.5)
Glucose: 98 mg/dL (ref 70–99)
Potassium: 4.1 mmol/L (ref 3.5–5.2)
Sodium: 138 mmol/L (ref 134–144)
Total Protein: 6.9 g/dL (ref 6.0–8.5)
eGFR: 71 mL/min/1.73 (ref >59)

## 2024-07-24 LAB — CBC WITH DIFFERENTIAL/PLATELET
Basophils Absolute: 0 x10E3/uL (ref 0.0–0.2)
Basos: 0 %
EOS (ABSOLUTE): 0.2 x10E3/uL (ref 0.0–0.4)
Eos: 3 %
Hematocrit: 42.5 % (ref 34.0–46.6)
Hemoglobin: 13.5 g/dL (ref 11.1–15.9)
Immature Grans (Abs): 0 x10E3/uL (ref 0.0–0.1)
Immature Granulocytes: 0 %
Lymphocytes Absolute: 2.4 x10E3/uL (ref 0.7–3.1)
Lymphs: 43 %
MCH: 30.7 pg (ref 26.6–33.0)
MCHC: 31.8 g/dL (ref 31.5–35.7)
MCV: 97 fL (ref 79–97)
Monocytes Absolute: 0.6 x10E3/uL (ref 0.1–0.9)
Monocytes: 11 %
Neutrophils Absolute: 2.5 x10E3/uL (ref 1.4–7.0)
Neutrophils: 43 %
Platelets: 187 x10E3/uL (ref 150–450)
RBC: 4.4 x10E6/uL (ref 3.77–5.28)
RDW: 14.5 % (ref 11.7–15.4)
WBC: 5.7 x10E3/uL (ref 3.4–10.8)

## 2024-07-24 LAB — HEMOGLOBIN A1C
Est. average glucose Bld gHb Est-mCnc: 123 mg/dL
Hgb A1c MFr Bld: 5.9 % — ABNORMAL HIGH (ref 4.8–5.6)

## 2024-07-24 LAB — LIPID PANEL
Chol/HDL Ratio: 2.7 ratio (ref 0.0–4.4)
Cholesterol, Total: 137 mg/dL (ref 100–199)
HDL: 51 mg/dL (ref >39)
LDL Chol Calc (NIH): 73 mg/dL (ref 0–99)
Triglycerides: 62 mg/dL (ref 0–149)
VLDL Cholesterol Cal: 13 mg/dL (ref 5–40)

## 2024-07-24 LAB — TSH: TSH: 3.11 u[IU]/mL (ref 0.450–4.500)

## 2024-07-26 ENCOUNTER — Encounter: Payer: Self-pay | Admitting: Cardiology

## 2024-07-26 ENCOUNTER — Ambulatory Visit (INDEPENDENT_AMBULATORY_CARE_PROVIDER_SITE_OTHER): Admitting: Cardiology

## 2024-07-26 VITALS — BP 122/79 | HR 88 | Ht 64.0 in | Wt 196.0 lb

## 2024-07-26 DIAGNOSIS — E782 Mixed hyperlipidemia: Secondary | ICD-10-CM

## 2024-07-26 DIAGNOSIS — I10 Essential (primary) hypertension: Secondary | ICD-10-CM

## 2024-07-26 DIAGNOSIS — R7303 Prediabetes: Secondary | ICD-10-CM

## 2024-07-26 MED ORDER — VITAMIN D (ERGOCALCIFEROL) 1.25 MG (50000 UNIT) PO CAPS: 50000.0000 [IU] | ORAL_CAPSULE | ORAL | 3 refills | Status: DC

## 2024-07-26 NOTE — Progress Notes (Signed)
 Established Patient Office Visit  Subjective:  Patient ID: Cristina Roberts, female    DOB: January 07, 1946  Age: 78 y.o. MRN: [id number]  Chief Complaint  Patient presents with   Follow-up    Follow up. Pt has been having blood in her stool since her appendix procedure    Patient in office for follow up, discuss recent lab work. Patient doing well over all. Patient reports having her appendix removed about three weeks ago, since then she has had blood in her stool two separate times, last time 4-5 days. Patient admits to constipation, straining while having a bowel movement, and a history of hemorrhoids. States she took three oxycodone  post op. Recommend a stool softener and Miralax. Discussed recent lab work. All labs within normal limits. Hgb A1c stable.  Continue same medications.     No other concerns at this time.   Past Medical History:  Diagnosis Date   Arthritis    Osteoarthritis and Rheumatoid    Cancer (HCC)    Lung    Chronic kidney disease    Heart disease    Hyperlipidemia    Hypertension     Past Surgical History:  Procedure Laterality Date   COLONOSCOPY WITH PROPOFOL  N/A 02/07/2018   Procedure: COLONOSCOPY WITH PROPOFOL ;  Surgeon: Gaylyn Gladis PENNER, MD;  Location[facility] ENDOSCOPY;  Service: Endoscopy;  Laterality: N/A;   fatty tumor removed     LAPAROSCOPIC APPENDECTOMY  06/29/2024   Procedure: APPENDECTOMY, LAPAROSCOPIC;  Surgeon: Marinda Jayson KIDD, MD;  Location: [facility] ORS;  Service: General;;   TOTAL HIP ARTHROPLASTY Right 09/10/2015   Procedure: TOTAL HIP ARTHROPLASTY;  Surgeon: Kayla Pinal, MD;  Location: [facility] ORS;  Service: Orthopedics;  Laterality: Right;   TUBAL LIGATION      Social History   Socioeconomic History   Marital status: Divorced    Spouse name: Not on file   Number of children: Not on file   Years of education: Not on file   Highest education level: Not on file  Occupational History   Not on file  Tobacco Use   Smoking  status: Never   Smokeless tobacco: Never  Vaping Use   Vaping status: Never Used  Substance and Sexual Activity   Alcohol use: No    Alcohol/week: 0.0 standard drinks of alcohol   Drug use: No   Sexual activity: Not on file  Other Topics Concern   Not on file  Social History Narrative   Not on file   Social Drivers of Health   Financial Resource Strain: Not on file  Food Insecurity: No Food Insecurity (06/30/2024)   Hunger Vital Sign    Worried About Running Out of Food in the Last Year: Never true    Ran Out of Food in the Last Year: Never true  Transportation Needs: No Transportation Needs (06/30/2024)   PRAPARE - Administrator, Civil Service (Medical): No    Lack of Transportation (Non-Medical): No  Physical Activity: Not on file  Stress: Not on file  Social Connections: Not on file  Intimate Partner Violence: Not At Risk (06/30/2024)   Humiliation, Afraid, Rape, and Kick questionnaire    Fear of Current or Ex-Partner: No    Emotionally Abused: No    Physically Abused: No    Sexually Abused: No    Family History  Problem Relation Age of Onset   Breast cancer Neg Hx     No Known Allergies  Outpatient Medications Prior to Visit  Medication  Sig   amLODipine  (NORVASC ) 5 MG tablet Take 1 tablet (5 mg total) by mouth daily.   furosemide  (LASIX ) 20 MG tablet Take 1 tablet (20 mg total) by mouth daily.   lisinopril -hydrochlorothiazide  (ZESTORETIC ) 20-25 MG tablet TAKE 1 TABLET BY MOUTH EVERY DAY   spironolactone  (ALDACTONE ) 25 MG tablet TAKE 1 TABLET BY MOUTH EVERY DAY   [DISCONTINUED] Vitamin D , Ergocalciferol , (DRISDOL ) 1.25 MG (50000 UNIT) CAPS capsule Take 50,000 Units by mouth every 7 (seven) days.   No facility-administered medications prior to visit.    Review of Systems  Constitutional: Negative.   HENT: Negative.    Eyes: Negative.   Respiratory: Negative.  Negative for shortness of breath.   Cardiovascular: Negative.  Negative for chest pain.   Gastrointestinal:  Positive for blood in stool and constipation. Negative for abdominal pain and diarrhea.  Genitourinary: Negative.   Musculoskeletal:  Negative for joint pain and myalgias.  Skin: Negative.   Neurological: Negative.  Negative for dizziness and headaches.  Endo/Heme/Allergies: Negative.   All other systems reviewed and are negative.      Objective:   BP 122/79   Pulse 88   Ht 5' 4 (1.626 m)   Wt 196 lb (88.9 kg)   SpO2 97%   BMI 33.64 kg/m   Vitals:   07/26/24 0851  BP: 122/79  Pulse: 88  Height: 5' 4 (1.626 m)  Weight: 196 lb (88.9 kg)  SpO2: 97%  BMI (Calculated): 33.63    Physical Exam Vitals and nursing note reviewed.  Constitutional:      Appearance: Normal appearance. She is normal weight.  HENT:     Head: Normocephalic and atraumatic.     Nose: Nose normal.     Mouth/Throat:     Mouth: Mucous membranes are moist.  Eyes:     Extraocular Movements: Extraocular movements intact.     Conjunctiva/sclera: Conjunctivae normal.     Pupils: Pupils are equal, round, and reactive to light.  Cardiovascular:     Rate and Rhythm: Normal rate and regular rhythm.     Pulses: Normal pulses.     Heart sounds: Normal heart sounds.  Pulmonary:     Effort: Pulmonary effort is normal.     Breath sounds: Normal breath sounds.  Abdominal:     General: Abdomen is flat. Bowel sounds are normal.     Palpations: Abdomen is soft.  Musculoskeletal:        General: Normal range of motion.     Cervical back: Normal range of motion.  Skin:    General: Skin is warm and dry.  Neurological:     General: No focal deficit present.     Mental Status: She is alert and oriented to person, place, and time.  Psychiatric:        Mood and Affect: Mood normal.        Behavior: Behavior normal.        Thought Content: Thought content normal.        Judgment: Judgment normal.      No results found for any visits on 07/26/24.  Recent Results (from the past 2160  hours)  Surgical pathology     Status: None   Collection Time: 06/29/24 12:00 AM  Result Value Ref Range   SURGICAL PATHOLOGY      SURGICAL PATHOLOGY [facility] [address] [place], [place] [address] Telephone [phone] or [phone] Fax [phone]  REPORT OF SURGICAL PATHOLOGY  Accession #: [id number] Patient Name: Mckelvie, Sascha Visit # : [id number]  MRN: [id number] Physician: Marinda Savant DOB/Age [birth year] (Age: 84) Gender: F Collected Date: 06/29/2024 Received Date: 07/02/2024  FINAL DIAGNOSIS       1. Appendix, Other than Incidental,  :       ACUTE APPENDICITIS WITH TRANSMURAL INFLAMMATION AND SEROSITIS.       DATE SIGNED OUT: 07/03/2024 ELECTRONIC SIGNATURE : Pepper Dutton Md, Pathologist, Electronic Signature  MICROSCOPIC DESCRIPTION  CASE COMMENTS STAINS USED IN DIAGNOSIS: H&E H&E    CLINICAL HISTORY  SPECIMEN(S) OBTAINED 1. Appendix, Other than Incidental,  SPECIMEN COMMENTS: SPECIMEN CLINICAL INFORMATION: 1. S/O appendicitis    Gross Description 1. Specimen: Appendix; intact, vermiform      Size: 6.5 cm l ong, 0.7 cm diameter (average); 7.1 x 3.0 x 1.5 cm portion of      attached adipose      Serosa: Gray-tan, dull, and smooth without discrete exudate. The staple line is      removed and under-inked blue.      Mucosa: Hemorrhagic-stained and flat, devoid of discrete lesions.      Wall: 0.1-0.2 cm thick; tan-white, solid, and homogenous.      Lumen: 0.3-0.5 cm diameter; contains a moderate amount of brown-tan, soft      material without discrete fecaliths.      Block Summary:      1A: Proximal sections including margin (en face)      1B: Distal sections including tip (bisected, entire)      AMG 07/02/2024        Report signed out from the following location(s) [place] [address] [place], [place] [address] CLIA #: [id number]  [facility] [address] [place], [place] [address] CLIA #: [id number]   Lipase, blood     Status: None   Collection Time: 06/29/24  8:14 AM  Result Value Ref Range   Lipase 28 11 - 51 U/L    Comment: Performed at [facility], [address] [place], [place] [address]  Comprehensive metabolic panel     Status: Abnormal   Collection Time: 06/29/24  8:14 AM  Result Value Ref Range   Sodium 135 135 - 145 mmol/L   Potassium 3.6 3.5 - 5.1 mmol/L   Chloride 101 98 - 111 mmol/L   CO2 23 22 - 32 mmol/L   Glucose, Bld 125 (H) 70 - 99 mg/dL    Comment: Glucose reference range applies only to samples taken after fasting for at least 8 hours.   BUN 15 8 - 23 mg/dL   Creatinine, Ser 9.29 0.44 - 1.00 mg/dL   Calcium  9.9 8.9 - 10.3 mg/dL   Total Protein 7.4 6.5 - 8.1 g/dL   Albumin 3.8 3.5 - 5.0 g/dL   AST 24 15 - 41 U/L   ALT 25 0 - 44 U/L   Alkaline Phosphatase 42 38 - 126 U/L   Total Bilirubin 0.6 0.0 - 1.2 mg/dL   GFR, Estimated >39 >39 mL/min    Comment: (NOTE) Calculated using the CKD-EPI Creatinine Equation (2021)    Anion gap 11 5 - 15    Comment: Performed at [facility], [address] [place], [place] [address]  CBC     Status: Abnormal   Collection Time: 06/29/24  8:14 AM  Result Value Ref Range   WBC 11.1 (H) 4.0 - 10.5 K/uL   RBC 4.71 3.87 - 5.11 MIL/uL  Hemoglobin 14.5 12.0 - 15.0 g/dL   HCT 57.5 63.9 - 53.9 %   MCV 90.0 80.0 - 100.0 fL   MCH 30.8 26.0 - 34.0 pg   MCHC 34.2 30.0 - 36.0 g/dL   RDW 86.5 88.4 - 84.4 %   Platelets 210 150 - 400 K/uL   nRBC 0.0 0.0 - 0.2 %    Comment: Performed at [facility], [address] [place], [place] [address]  Urinalysis, Routine w reflex microscopic -Urine, Clean Catch     Status: Abnormal   Collection Time: 06/29/24 12:10 PM  Result Value Ref Range   Color, Urine YELLOW (A) YELLOW   APPearance CLEAR (A) CLEAR   Specific Gravity, Urine >1.046 (H) 1.005 - 1.030   pH 6.0 5.0 - 8.0   Glucose, UA  NEGATIVE NEGATIVE mg/dL   Hgb urine dipstick NEGATIVE NEGATIVE   Bilirubin Urine NEGATIVE NEGATIVE   Ketones, ur NEGATIVE NEGATIVE mg/dL   Protein, ur NEGATIVE NEGATIVE mg/dL   Nitrite NEGATIVE NEGATIVE   Leukocytes,Ua NEGATIVE NEGATIVE    Comment: Performed at [facility], [address] [place], [place] [address]  Glucose, capillary     Status: Abnormal   Collection Time: 06/29/24  8:33 PM  Result Value Ref Range   Glucose-Capillary 176 (H) 70 - 99 mg/dL    Comment: Glucose reference range applies only to samples taken after fasting for at least 8 hours.  CBC with Differential/Platelet     Status: None   Collection Time: 07/23/24 10:22 AM  Result Value Ref Range   WBC 5.7 3.4 - 10.8 x10E3/uL   RBC 4.40 3.77 - 5.28 x10E6/uL   Hemoglobin 13.5 11.1 - 15.9 g/dL   Hematocrit 57.4 65.9 - 46.6 %   MCV 97 79 - 97 fL   MCH 30.7 26.6 - 33.0 pg   MCHC 31.8 31.5 - 35.7 g/dL   RDW 85.4 88.2 - 84.5 %   Platelets 187 150 - 450 x10E3/uL   Neutrophils 43 Not Estab. %   Lymphs 43 Not Estab. %   Monocytes 11 Not Estab. %   Eos 3 Not Estab. %   Basos 0 Not Estab. %   Neutrophils Absolute 2.5 1.4 - 7.0 x10E3/uL   Lymphocytes Absolute 2.4 0.7 - 3.1 x10E3/uL   Monocytes Absolute 0.6 0.1 - 0.9 x10E3/uL   EOS (ABSOLUTE) 0.2 0.0 - 0.4 x10E3/uL   Basophils Absolute 0.0 0.0 - 0.2 x10E3/uL   Immature Granulocytes 0 Not Estab. %   Immature Grans (Abs) 0.0 0.0 - 0.1 x10E3/uL  TSH     Status: None   Collection Time: 07/23/24 10:22 AM  Result Value Ref Range   TSH 3.110 0.450 - 4.500 uIU/mL  CMP14+EGFR     Status: None   Collection Time: 07/23/24 10:22 AM  Result Value Ref Range   Glucose 98 70 - 99 mg/dL   BUN 17 8 - 27 mg/dL   Creatinine, Ser 9.15 0.57 - 1.00 mg/dL   eGFR 71 >40 fO/fpw/8.26   BUN/Creatinine Ratio 20 12 - 28   Sodium 138 134 - 144 mmol/L   Potassium 4.1 3.5 - 5.2 mmol/L   Chloride 102 96 - 106 mmol/L   CO2 21 20 - 29 mmol/L   Calcium  9.8 8.7 - 10.3 mg/dL   Total  Protein 6.9 6.0 - 8.5 g/dL   Albumin 4.0 3.8 - 4.8 g/dL   Globulin, Total 2.9 1.5 - 4.5 g/dL   Bilirubin Total 0.4 0.0 - 1.2  mg/dL   Alkaline Phosphatase 55 44 - 121 IU/L   AST 20 0 - 40 IU/L   ALT 22 0 - 32 IU/L  Hemoglobin A1c     Status: Abnormal   Collection Time: 07/23/24 10:22 AM  Result Value Ref Range   Hgb A1c MFr Bld 5.9 (H) 4.8 - 5.6 %    Comment:          Prediabetes: 5.7 - 6.4          Diabetes: >6.4          Glycemic control for adults with diabetes: <7.0    Est. average glucose Bld gHb Est-mCnc 123 mg/dL  Lipid panel     Status: None   Collection Time: 07/23/24 10:22 AM  Result Value Ref Range   Cholesterol, Total 137 100 - 199 mg/dL   Triglycerides 62 0 - 149 mg/dL   HDL 51 >60 mg/dL   VLDL Cholesterol Cal 13 5 - 40 mg/dL   LDL Chol Calc (NIH) 73 0 - 99 mg/dL   Chol/HDL Ratio 2.7 0.0 - 4.4 ratio    Comment:                                   T. Chol/HDL Ratio                                             Men  Women                               1/2 Avg.Risk  3.4    3.3                                   Avg.Risk  5.0    4.4                                2X Avg.Risk  9.6    7.1                                3X Avg.Risk 23.4   11.0       Assessment & Plan:  Stool softener Miralax Continue same mediations  Problem List Items Addressed This Visit       Cardiovascular and Mediastinum   Essential hypertension, benign - Primary     Other   Mixed hyperlipidemia   Prediabetes    Return in about 4 months (around 11/25/2024) for fasting labs prior.   Total time spent: 25 minutes  [employer], NP  07/26/2024   This document may have been prepared by Dragon Voice Recognition software and as such may include unintentional dictation errors.

## 2024-07-26 NOTE — Patient Instructions (Signed)
-  Stool softener  -Miralax

## 2024-08-15 ENCOUNTER — Encounter: Payer: Self-pay | Admitting: Cardiology

## 2024-08-15 ENCOUNTER — Ambulatory Visit: Payer: Self-pay | Admitting: Cardiology

## 2024-08-15 ENCOUNTER — Ambulatory Visit (INDEPENDENT_AMBULATORY_CARE_PROVIDER_SITE_OTHER): Admitting: Cardiology

## 2024-08-15 VITALS — BP 128/70 | HR 73 | Ht 64.0 in | Wt 193.4 lb

## 2024-08-15 DIAGNOSIS — R350 Frequency of micturition: Secondary | ICD-10-CM | POA: Diagnosis not present

## 2024-08-15 DIAGNOSIS — Z0130 Encounter for examination of blood pressure without abnormal findings: Secondary | ICD-10-CM

## 2024-08-15 DIAGNOSIS — K921 Melena: Secondary | ICD-10-CM | POA: Diagnosis not present

## 2024-08-15 LAB — POCT URINALYSIS DIPSTICK
Bilirubin, UA: NEGATIVE
Blood, UA: POSITIVE
Glucose, UA: NEGATIVE
Ketones, UA: NEGATIVE
Leukocytes, UA: NEGATIVE
Nitrite, UA: NEGATIVE
Protein, UA: NEGATIVE
Spec Grav, UA: 1.02 (ref 1.010–1.025)
Urobilinogen, UA: 0.2 U/dL
pH, UA: 6 (ref 5.0–8.0)

## 2024-08-15 NOTE — Progress Notes (Signed)
 Established Patient Office Visit  Subjective:  Patient ID: Cristina Roberts, female    DOB: 08-15-1946  Age: 78 y.o. MRN: [id number]  Chief Complaint  Patient presents with   Acute Visit    Blood in stool started after surgery on July 11th for appendicitis. Says its a 2 on the pain scale.    Patient in office for an acute visit, blood in stool. Patient doing well over all. Patient reports having her appendix removed about six weeks ago Patient seen on 07/26/24 with similar complaints. At that time, patient reported constipation, straining while having a bowel movement, and a history of hemorrhoids. States she took three oxycodone  post op. Recommended a stool softener and Miralax. Patient returns today with similar complaints. Will refer to GI. Hemoccult card given to patient today. CBC on 07/23/24 normal.     No other concerns at this time.   Past Medical History:  Diagnosis Date   Arthritis    Osteoarthritis and Rheumatoid    Cancer (HCC)    Lung    Chronic kidney disease    Heart disease    Hyperlipidemia    Hypertension     Past Surgical History:  Procedure Laterality Date   COLONOSCOPY WITH PROPOFOL  N/A 02/07/2018   Procedure: COLONOSCOPY WITH PROPOFOL ;  Surgeon: Gaylyn Gladis PENNER, MD;  Location: [facility] ENDOSCOPY;  Service: Endoscopy;  Laterality: N/A;   fatty tumor removed     LAPAROSCOPIC APPENDECTOMY  06/29/2024   Procedure: APPENDECTOMY, LAPAROSCOPIC;  Surgeon: Marinda Jayson KIDD, MD;  Location: [facility] ORS;  Service: General;;   TOTAL HIP ARTHROPLASTY Right 09/10/2015   Procedure: TOTAL HIP ARTHROPLASTY;  Surgeon: Kayla Pinal, MD;  Location: [facility] ORS;  Service: Orthopedics;  Laterality: Right;   TUBAL LIGATION      Social History   Socioeconomic History   Marital status: Divorced    Spouse name: Not on file   Number of children: Not on file   Years of education: Not on file   Highest education level: Not on file  Occupational History   Not on file  Tobacco  Use   Smoking status: Never   Smokeless tobacco: Never  Vaping Use   Vaping status: Never Used  Substance and Sexual Activity   Alcohol use: No    Alcohol/week: 0.0 standard drinks of alcohol   Drug use: No   Sexual activity: Not on file  Other Topics Concern   Not on file  Social History Narrative   Not on file   Social Drivers of Health   Financial Resource Strain: Not on file  Food Insecurity: No Food Insecurity (06/30/2024)   Hunger Vital Sign    Worried About Running Out of Food in the Last Year: Never true    Ran Out of Food in the Last Year: Never true  Transportation Needs: No Transportation Needs (06/30/2024)   PRAPARE - Administrator, Civil Service (Medical): No    Lack of Transportation (Non-Medical): No  Physical Activity: Not on file  Stress: Not on file  Social Connections: Not on file  Intimate Partner Violence: Not At Risk (06/30/2024)   Humiliation, Afraid, Rape, and Kick questionnaire    Fear of Current or Ex-Partner: No    Emotionally Abused: No    Physically Abused: No    Sexually Abused: No    Family History  Problem Relation Age of Onset   Breast cancer Neg Hx     No Known Allergies  Outpatient Medications Prior to  Visit  Medication Sig   amLODipine  (NORVASC ) 5 MG tablet Take 1 tablet (5 mg total) by mouth daily.   furosemide  (LASIX ) 20 MG tablet Take 1 tablet (20 mg total) by mouth daily.   lisinopril -hydrochlorothiazide  (ZESTORETIC ) 20-25 MG tablet TAKE 1 TABLET BY MOUTH EVERY DAY   spironolactone  (ALDACTONE ) 25 MG tablet TAKE 1 TABLET BY MOUTH EVERY DAY   Vitamin D , Ergocalciferol , (DRISDOL ) 1.25 MG (50000 UNIT) CAPS capsule Take 1 capsule (50,000 Units total) by mouth every 7 (seven) days.   No facility-administered medications prior to visit.    Review of Systems  Constitutional: Negative.   HENT: Negative.    Eyes: Negative.   Respiratory: Negative.  Negative for shortness of breath.   Cardiovascular: Negative.  Negative  for chest pain.  Gastrointestinal:  Positive for blood in stool. Negative for abdominal pain, constipation and diarrhea.  Genitourinary: Negative.   Musculoskeletal:  Negative for joint pain and myalgias.  Skin: Negative.   Neurological: Negative.  Negative for dizziness and headaches.  Endo/Heme/Allergies: Negative.   All other systems reviewed and are negative.      Objective:   BP 128/70   Pulse 73   Ht 5' 4 (1.626 m)   Wt 193 lb 6.4 oz (87.7 kg)   SpO2 98%   BMI 33.20 kg/m   Vitals:   08/15/24 0936  BP: 128/70  Pulse: 73  Height: 5' 4 (1.626 m)  Weight: 193 lb 6.4 oz (87.7 kg)  SpO2: 98%  BMI (Calculated): 33.18    Physical Exam Vitals and nursing note reviewed.  Constitutional:      Appearance: Normal appearance. She is normal weight.  HENT:     Head: Normocephalic and atraumatic.     Nose: Nose normal.     Mouth/Throat:     Mouth: Mucous membranes are moist.  Eyes:     Extraocular Movements: Extraocular movements intact.     Conjunctiva/sclera: Conjunctivae normal.     Pupils: Pupils are equal, round, and reactive to light.  Cardiovascular:     Rate and Rhythm: Normal rate and regular rhythm.     Pulses: Normal pulses.     Heart sounds: Normal heart sounds.  Pulmonary:     Effort: Pulmonary effort is normal.     Breath sounds: Normal breath sounds.  Abdominal:     General: Abdomen is flat. Bowel sounds are normal.     Palpations: Abdomen is soft.  Musculoskeletal:        General: Normal range of motion.     Cervical back: Normal range of motion.  Skin:    General: Skin is warm and dry.  Neurological:     General: No focal deficit present.     Mental Status: She is alert and oriented to person, place, and time.  Psychiatric:        Mood and Affect: Mood normal.        Behavior: Behavior normal.        Thought Content: Thought content normal.        Judgment: Judgment normal.      Results for orders placed or performed in visit on 08/15/24   POCT urinalysis dipstick  Result Value Ref Range   Color, UA     Clarity, UA     Glucose, UA Negative Negative   Bilirubin, UA negative    Ketones, UA negative    Spec Grav, UA 1.020 1.010 - 1.025   Blood, UA positive    pH, UA  6.0 5.0 - 8.0   Protein, UA Negative Negative   Urobilinogen, UA 0.2 0.2 or 1.0 E.U./dL   Nitrite, UA negative    Leukocytes, UA Negative Negative   Appearance     Odor      Recent Results (from the past 2160 hours)  Surgical pathology     Status: None   Collection Time: 06/29/24 12:00 AM  Result Value Ref Range   SURGICAL PATHOLOGY      SURGICAL PATHOLOGY [facility] [address], [address] [place], [place] [address] Telephone [phone] or [phone] Fax [phone]  REPORT OF SURGICAL PATHOLOGY   Accession #: [id number] Patient Name: EDYE, HAINLINE Visit # : [id number]  MRN: [id number] Physician: Marinda Savant DOB/Age [birth year] (Age: 48) Gender: F Collected Date: 06/29/2024 Received Date: 07/02/2024  FINAL DIAGNOSIS       1. Appendix, Other than Incidental,  :       ACUTE APPENDICITIS WITH TRANSMURAL INFLAMMATION AND SEROSITIS.       DATE SIGNED OUT: 07/03/2024 ELECTRONIC SIGNATURE : Pepper Dutton Md, Pathologist, Electronic Signature  MICROSCOPIC DESCRIPTION  CASE COMMENTS STAINS USED IN DIAGNOSIS: H&E H&E    CLINICAL HISTORY  SPECIMEN(S) OBTAINED 1. Appendix, Other than Incidental,  SPECIMEN COMMENTS: SPECIMEN CLINICAL INFORMATION: 1. S/O appendicitis    Gross Description 1. Specimen: Appendix; intact, vermiform      Size: 6.5 cm l ong, 0.7 cm diameter (average); 7.1 x 3.0 x 1.5 cm portion of      attached adipose      Serosa: Gray-tan, dull, and smooth without discrete exudate. The staple line is      removed and under-inked blue.      Mucosa: Hemorrhagic-stained and flat, devoid of discrete lesions.      Wall: 0.1-0.2 cm thick; tan-white, solid, and homogenous.      Lumen:  0.3-0.5 cm diameter; contains a moderate amount of brown-tan, soft      material without discrete fecaliths.      Block Summary:      1A: Proximal sections including margin (en face)      1B: Distal sections including tip (bisected, entire)      AMG 07/02/2024        Report signed out from the following location(s) [place] [address] [place], [place] [address] CLIA #: [id number]  [facility] [address] [place], [place] [address] CLIA #: [id number]   Lipase, blood     Status: None   Collection Time: 06/29/24  8:14 AM  Result Value Ref Range   Lipase 28 11 - 51 U/L    Comment: Performed at [facility], [address] [place], [place] [address]  Comprehensive metabolic panel     Status: Abnormal   Collection Time: 06/29/24  8:14 AM  Result Value Ref Range   Sodium 135 135 - 145 mmol/L   Potassium 3.6 3.5 - 5.1 mmol/L   Chloride 101 98 - 111 mmol/L   CO2 23 22 - 32 mmol/L   Glucose, Bld 125 (H) 70 - 99 mg/dL    Comment: Glucose reference range applies only to samples taken after fasting for at least 8 hours.   BUN 15 8 - 23 mg/dL   Creatinine, Ser 9.29 0.44 - 1.00 mg/dL   Calcium  9.9 8.9 - 10.3 mg/dL   Total Protein 7.4 6.5 - 8.1 g/dL   Albumin 3.8 3.5 - 5.0 g/dL   AST 24 15 -  41 U/L   ALT 25 0 - 44 U/L   Alkaline Phosphatase 42 38 - 126 U/L   Total Bilirubin 0.6 0.0 - 1.2 mg/dL   GFR, Estimated >39 >39 mL/min    Comment: (NOTE) Calculated using the CKD-EPI Creatinine Equation (2021)    Anion gap 11 5 - 15    Comment: Performed at [facility], [address] [place], [place] [address]  CBC     Status: Abnormal   Collection Time: 06/29/24  8:14 AM  Result Value Ref Range   WBC 11.1 (H) 4.0 - 10.5 K/uL   RBC 4.71 3.87 - 5.11 MIL/uL   Hemoglobin 14.5 12.0 - 15.0 g/dL   HCT 57.5 63.9 - 53.9 %   MCV 90.0 80.0 - 100.0 fL   MCH 30.8 26.0 - 34.0 pg   MCHC 34.2 30.0 - 36.0 g/dL   RDW 86.5 88.4 - 84.4  %   Platelets 210 150 - 400 K/uL   nRBC 0.0 0.0 - 0.2 %    Comment: Performed at [facility], [address] [place], [place] [address]  Urinalysis, Routine w reflex microscopic -Urine, Clean Catch     Status: Abnormal   Collection Time: 06/29/24 12:10 PM  Result Value Ref Range   Color, Urine YELLOW (A) YELLOW   APPearance CLEAR (A) CLEAR   Specific Gravity, Urine >1.046 (H) 1.005 - 1.030   pH 6.0 5.0 - 8.0   Glucose, UA NEGATIVE NEGATIVE mg/dL   Hgb urine dipstick NEGATIVE NEGATIVE   Bilirubin Urine NEGATIVE NEGATIVE   Ketones, ur NEGATIVE NEGATIVE mg/dL   Protein, ur NEGATIVE NEGATIVE mg/dL   Nitrite NEGATIVE NEGATIVE   Leukocytes,Ua NEGATIVE NEGATIVE    Comment: Performed at [facility], [address] [place], [place] [address]  Glucose, capillary     Status: Abnormal   Collection Time: 06/29/24  8:33 PM  Result Value Ref Range   Glucose-Capillary 176 (H) 70 - 99 mg/dL    Comment: Glucose reference range applies only to samples taken after fasting for at least 8 hours.  CBC with Differential/Platelet     Status: None   Collection Time: 07/23/24 10:22 AM  Result Value Ref Range   WBC 5.7 3.4 - 10.8 x10E3/uL   RBC 4.40 3.77 - 5.28 x10E6/uL   Hemoglobin 13.5 11.1 - 15.9 g/dL   Hematocrit 57.4 65.9 - 46.6 %   MCV 97 79 - 97 fL   MCH 30.7 26.6 - 33.0 pg   MCHC 31.8 31.5 - 35.7 g/dL   RDW 85.4 88.2 - 84.5 %   Platelets 187 150 - 450 x10E3/uL   Neutrophils 43 Not Estab. %   Lymphs 43 Not Estab. %   Monocytes 11 Not Estab. %   Eos 3 Not Estab. %   Basos 0 Not Estab. %   Neutrophils Absolute 2.5 1.4 - 7.0 x10E3/uL   Lymphocytes Absolute 2.4 0.7 - 3.1 x10E3/uL   Monocytes Absolute 0.6 0.1 - 0.9 x10E3/uL   EOS (ABSOLUTE) 0.2 0.0 - 0.4 x10E3/uL   Basophils Absolute 0.0 0.0 - 0.2 x10E3/uL   Immature Granulocytes 0 Not Estab. %   Immature Grans (Abs) 0.0 0.0 - 0.1 x10E3/uL  TSH     Status: None   Collection Time: 07/23/24 10:22 AM  Result Value Ref  Range   TSH 3.110 0.450 - 4.500 uIU/mL  CMP14+EGFR     Status: None   Collection Time: 07/23/24 10:22 AM  Result Value Ref Range  Glucose 98 70 - 99 mg/dL   BUN 17 8 - 27 mg/dL   Creatinine, Ser 9.15 0.57 - 1.00 mg/dL   eGFR 71 >40 fO/fpw/8.26   BUN/Creatinine Ratio 20 12 - 28   Sodium 138 134 - 144 mmol/L   Potassium 4.1 3.5 - 5.2 mmol/L   Chloride 102 96 - 106 mmol/L   CO2 21 20 - 29 mmol/L   Calcium  9.8 8.7 - 10.3 mg/dL   Total Protein 6.9 6.0 - 8.5 g/dL   Albumin 4.0 3.8 - 4.8 g/dL   Globulin, Total 2.9 1.5 - 4.5 g/dL   Bilirubin Total 0.4 0.0 - 1.2 mg/dL   Alkaline Phosphatase 55 44 - 121 IU/L   AST 20 0 - 40 IU/L   ALT 22 0 - 32 IU/L  Hemoglobin A1c     Status: Abnormal   Collection Time: 07/23/24 10:22 AM  Result Value Ref Range   Hgb A1c MFr Bld 5.9 (H) 4.8 - 5.6 %    Comment:          Prediabetes: 5.7 - 6.4          Diabetes: >6.4          Glycemic control for adults with diabetes: <7.0    Est. average glucose Bld gHb Est-mCnc 123 mg/dL  Lipid panel     Status: None   Collection Time: 07/23/24 10:22 AM  Result Value Ref Range   Cholesterol, Total 137 100 - 199 mg/dL   Triglycerides 62 0 - 149 mg/dL   HDL 51 >60 mg/dL   VLDL Cholesterol Cal 13 5 - 40 mg/dL   LDL Chol Calc (NIH) 73 0 - 99 mg/dL   Chol/HDL Ratio 2.7 0.0 - 4.4 ratio    Comment:                                   T. Chol/HDL Ratio                                             Men  Women                               1/2 Avg.Risk  3.4    3.3                                   Avg.Risk  5.0    4.4                                2X Avg.Risk  9.6    7.1                                3X Avg.Risk 23.4   11.0   POCT urinalysis dipstick     Status: Abnormal   Collection Time: 08/15/24  9:58 AM  Result Value Ref Range   Color, UA     Clarity, UA     Glucose, UA Negative Negative   Bilirubin, UA negative    Ketones, UA negative    Spec Grav,  UA 1.020 1.010 - 1.025   Blood, UA positive    pH, UA 6.0  5.0 - 8.0   Protein, UA Negative Negative   Urobilinogen, UA 0.2 0.2 or 1.0 E.U./dL   Nitrite, UA negative    Leukocytes, UA Negative Negative   Appearance     Odor        Assessment & Plan:  Referral sent to GI Hemoccult card given to patient  Problem List Items Addressed This Visit       Other   Blood in stool - Primary   Relevant Orders   POCT urinalysis dipstick (Completed)   Ambulatory referral to Gastroenterology   Other Visit Diagnoses       Frequent urination       Relevant Orders   POCT urinalysis dipstick (Completed)       Return if symptoms worsen or fail to improve, for as scheduled.   Total time spent: 25 minutes  [employer], NP  08/15/2024   This document may have been prepared by Dragon Voice Recognition software and as such may include unintentional dictation errors.

## 2024-08-17 ENCOUNTER — Other Ambulatory Visit: Payer: Self-pay | Admitting: Cardiovascular Disease

## 2024-08-17 DIAGNOSIS — E785 Hyperlipidemia, unspecified: Secondary | ICD-10-CM

## 2024-08-21 ENCOUNTER — Ambulatory Visit: Payer: Self-pay | Admitting: Cardiology

## 2024-08-21 ENCOUNTER — Other Ambulatory Visit: Payer: Self-pay

## 2024-08-21 DIAGNOSIS — K921 Melena: Secondary | ICD-10-CM

## 2024-08-21 LAB — POC HEMOCCULT BLD/STL (HOME/3-CARD/SCREEN)
Card #2 Fecal Occult Blod, POC: NEGATIVE
Card #3 Fecal Occult Blood, POC: NEGATIVE
Fecal Occult Blood, POC: NEGATIVE

## 2024-08-22 NOTE — Progress Notes (Signed)
Pt informed

## 2024-08-24 ENCOUNTER — Other Ambulatory Visit: Payer: Self-pay | Admitting: Cardiovascular Disease

## 2024-08-24 DIAGNOSIS — R609 Edema, unspecified: Secondary | ICD-10-CM

## 2024-09-25 ENCOUNTER — Other Ambulatory Visit: Payer: Self-pay | Admitting: Cardiovascular Disease

## 2024-09-25 DIAGNOSIS — I5189 Other ill-defined heart diseases: Secondary | ICD-10-CM

## 2024-09-25 DIAGNOSIS — I5033 Acute on chronic diastolic (congestive) heart failure: Secondary | ICD-10-CM

## 2024-09-25 DIAGNOSIS — R0602 Shortness of breath: Secondary | ICD-10-CM

## 2024-09-25 DIAGNOSIS — E782 Mixed hyperlipidemia: Secondary | ICD-10-CM

## 2024-09-25 DIAGNOSIS — I10 Essential (primary) hypertension: Secondary | ICD-10-CM

## 2024-10-05 DIAGNOSIS — Z96649 Presence of unspecified artificial hip joint: Secondary | ICD-10-CM | POA: Diagnosis not present

## 2024-10-05 DIAGNOSIS — I38 Endocarditis, valve unspecified: Secondary | ICD-10-CM | POA: Diagnosis not present

## 2024-10-05 DIAGNOSIS — I509 Heart failure, unspecified: Secondary | ICD-10-CM | POA: Diagnosis not present

## 2024-10-05 DIAGNOSIS — N182 Chronic kidney disease, stage 2 (mild): Secondary | ICD-10-CM | POA: Diagnosis not present

## 2024-10-05 DIAGNOSIS — E785 Hyperlipidemia, unspecified: Secondary | ICD-10-CM | POA: Diagnosis not present

## 2024-10-05 DIAGNOSIS — E669 Obesity, unspecified: Secondary | ICD-10-CM | POA: Diagnosis not present

## 2024-10-05 DIAGNOSIS — I130 Hypertensive heart and chronic kidney disease with heart failure and stage 1 through stage 4 chronic kidney disease, or unspecified chronic kidney disease: Secondary | ICD-10-CM | POA: Diagnosis not present

## 2024-10-05 DIAGNOSIS — M1990 Unspecified osteoarthritis, unspecified site: Secondary | ICD-10-CM | POA: Diagnosis not present

## 2024-10-05 DIAGNOSIS — Z8249 Family history of ischemic heart disease and other diseases of the circulatory system: Secondary | ICD-10-CM | POA: Diagnosis not present

## 2024-10-12 ENCOUNTER — Encounter: Payer: Self-pay | Admitting: Cardiovascular Disease

## 2024-10-12 ENCOUNTER — Ambulatory Visit: Admitting: Cardiovascular Disease

## 2024-10-12 VITALS — BP 128/68 | HR 80 | Ht 64.0 in | Wt 198.6 lb

## 2024-10-12 DIAGNOSIS — I5032 Chronic diastolic (congestive) heart failure: Secondary | ICD-10-CM

## 2024-10-12 DIAGNOSIS — R7303 Prediabetes: Secondary | ICD-10-CM

## 2024-10-12 DIAGNOSIS — E782 Mixed hyperlipidemia: Secondary | ICD-10-CM

## 2024-10-12 DIAGNOSIS — I10 Essential (primary) hypertension: Secondary | ICD-10-CM

## 2024-10-12 DIAGNOSIS — I38 Endocarditis, valve unspecified: Secondary | ICD-10-CM | POA: Diagnosis not present

## 2024-10-12 NOTE — Progress Notes (Signed)
 Cardiology Office Note   Date:  10/12/2024   ID:  Cristina Roberts, DOB [birth year], MRN [id number]  PCP:  Carin Gauze, NP  Cardiologist:  Denyse Bathe, MD      History of Present Illness: Cristina Roberts is a 78 y.o. female who presents for  Chief Complaint  Patient presents with   Follow-up    3 month follow up    No SOB, or chest pains.      Past Medical History:  Diagnosis Date   Arthritis    Osteoarthritis and Rheumatoid    Cancer (HCC)    Lung    Chronic kidney disease    Heart disease    Hyperlipidemia    Hypertension      Past Surgical History:  Procedure Laterality Date   COLONOSCOPY WITH PROPOFOL  N/A 02/07/2018   Procedure: COLONOSCOPY WITH PROPOFOL ;  Surgeon: Gaylyn Gladis PENNER, MD;  Location: [facility] ENDOSCOPY;  Service: Endoscopy;  Laterality: N/A;   fatty tumor removed     LAPAROSCOPIC APPENDECTOMY  06/29/2024   Procedure: APPENDECTOMY, LAPAROSCOPIC;  Surgeon: Marinda Jayson KIDD, MD;  Location: [facility] ORS;  Service: General;;   TOTAL HIP ARTHROPLASTY Right 09/10/2015   Procedure: TOTAL HIP ARTHROPLASTY;  Surgeon: Kayla Pinal, MD;  Location: [facility] ORS;  Service: Orthopedics;  Laterality: Right;   TUBAL LIGATION       Current Outpatient Medications  Medication Sig Dispense Refill   amLODipine  (NORVASC ) 5 MG tablet Take 1 tablet (5 mg total) by mouth daily. 90 tablet 3   furosemide  (LASIX ) 20 MG tablet TAKE 1 TABLET BY MOUTH EVERY DAY 90 tablet 3   lisinopril -hydrochlorothiazide  (ZESTORETIC ) 20-25 MG tablet TAKE 1 TABLET BY MOUTH EVERY DAY 90 tablet 3   rosuvastatin (CRESTOR) 20 MG tablet TAKE 1 TABLET BY MOUTH EVERY DAY 90 tablet 1   spironolactone  (ALDACTONE ) 25 MG tablet TAKE 1 TABLET BY MOUTH EVERY DAY 90 tablet 0   Vitamin D , Ergocalciferol , (DRISDOL ) 1.25 MG (50000 UNIT) CAPS capsule Take 1 capsule (50,000 Units total) by mouth every 7 (seven) days. 15 capsule 3   No current facility-administered medications for this visit.     Allergies:   Patient has no known allergies.    Social History:   reports that she has never smoked. She has never used smokeless tobacco. She reports that she does not drink alcohol and does not use drugs.   Family History:  family history is not on file.    ROS:     Review of Systems  Constitutional: Negative.   HENT: Negative.    Eyes: Negative.   Respiratory: Negative.    Gastrointestinal: Negative.   Genitourinary: Negative.   Musculoskeletal: Negative.   Skin: Negative.   Neurological: Negative.   Endo/Heme/Allergies: Negative.   Psychiatric/Behavioral: Negative.    All other systems reviewed and are negative.     All other systems are reviewed and negative.    PHYSICAL EXAM: VS:  BP 128/68   Pulse 80   Ht 5' 4 (1.626 m)   Wt 198 lb 9.6 oz (90.1 kg)   SpO2 97%   BMI 34.09 kg/m  , BMI Body mass index is 34.09 kg/m. Last weight:  Wt Readings from Last 3 Encounters:  10/12/24 198 lb 9.6 oz (90.1 kg)  08/15/24 193 lb 6.4 oz (87.7 kg)  07/26/24 196 lb (88.9 kg)     Physical Exam Constitutional:      Appearance: Normal appearance.  Cardiovascular:     Rate and  Rhythm: Normal rate and regular rhythm.     Heart sounds: Normal heart sounds.  Pulmonary:     Effort: Pulmonary effort is normal.     Breath sounds: Normal breath sounds.  Musculoskeletal:     Right lower leg: No edema.     Left lower leg: No edema.  Neurological:     Mental Status: She is alert.       EKG:   Recent Labs: 07/23/2024: ALT 22; BUN 17; Creatinine, Ser 0.84; Hemoglobin 13.5; Platelets 187; Potassium 4.1; Sodium 138; TSH 3.110    Lipid Panel    Component Value Date/Time   CHOL 137 07/23/2024 1022   TRIG 62 07/23/2024 1022   HDL 51 07/23/2024 1022   CHOLHDL 2.7 07/23/2024 1022   LDLCALC 73 07/23/2024 1022      Other studies Reviewed: Additional studies/ records that were reviewed today include:  Review of the above records demonstrates:       No data to  display            ASSESSMENT AND PLAN:    ICD-10-CM   1. Essential hypertension, benign  I10     2. Heart valve problem  I38     3. Mixed hyperlipidemia  E78.2     4. Prediabetes  R73.03     5. CHF (congestive heart failure), NYHA class I, chronic, diastolic (HCC)  I50.32    On aldactone  less SOB. Has grade 1 diastolic dysfunction.       Problem List Items Addressed This Visit       Cardiovascular and Mediastinum   Essential hypertension, benign - Primary   Heart valve problem     Other   Mixed hyperlipidemia   Prediabetes   Other Visit Diagnoses       CHF (congestive heart failure), NYHA class I, chronic, diastolic (HCC)       On aldactone  less SOB. Has grade 1 diastolic dysfunction.          Disposition:   Return in about 3 months (around 01/12/2025).    Total time spent: 30 minutes  Signed,  Denyse Bathe, MD  10/12/2024 10:28 AM    [facility]

## 2024-11-13 DIAGNOSIS — H5203 Hypermetropia, bilateral: Secondary | ICD-10-CM | POA: Diagnosis not present

## 2024-11-13 DIAGNOSIS — H2512 Age-related nuclear cataract, left eye: Secondary | ICD-10-CM | POA: Diagnosis not present

## 2024-11-13 DIAGNOSIS — H2511 Age-related nuclear cataract, right eye: Secondary | ICD-10-CM | POA: Diagnosis not present

## 2024-11-23 ENCOUNTER — Other Ambulatory Visit: Payer: Self-pay | Admitting: Cardiovascular Disease

## 2024-11-23 DIAGNOSIS — R609 Edema, unspecified: Secondary | ICD-10-CM

## 2024-11-26 ENCOUNTER — Other Ambulatory Visit

## 2024-11-26 DIAGNOSIS — E782 Mixed hyperlipidemia: Secondary | ICD-10-CM

## 2024-11-26 DIAGNOSIS — I10 Essential (primary) hypertension: Secondary | ICD-10-CM

## 2024-11-26 DIAGNOSIS — R7303 Prediabetes: Secondary | ICD-10-CM | POA: Diagnosis not present

## 2024-11-26 DIAGNOSIS — Z1329 Encounter for screening for other suspected endocrine disorder: Secondary | ICD-10-CM | POA: Diagnosis not present

## 2024-11-27 ENCOUNTER — Ambulatory Visit: Payer: Self-pay | Admitting: Cardiology

## 2024-11-27 LAB — CMP14+EGFR
ALT: 24 IU/L (ref 0–32)
AST: 23 IU/L (ref 0–40)
Albumin: 4.1 g/dL (ref 3.8–4.8)
Alkaline Phosphatase: 55 IU/L (ref 49–135)
BUN/Creatinine Ratio: 20 (ref 12–28)
BUN: 18 mg/dL (ref 8–27)
Bilirubin Total: 0.3 mg/dL (ref 0.0–1.2)
CO2: 24 mmol/L (ref 20–29)
Calcium: 9.6 mg/dL (ref 8.7–10.3)
Chloride: 100 mmol/L (ref 96–106)
Creatinine, Ser: 0.92 mg/dL (ref 0.57–1.00)
Globulin, Total: 2.9 g/dL (ref 1.5–4.5)
Glucose: 104 mg/dL — ABNORMAL HIGH (ref 70–99)
Potassium: 4.1 mmol/L (ref 3.5–5.2)
Sodium: 136 mmol/L (ref 134–144)
Total Protein: 7 g/dL (ref 6.0–8.5)
eGFR: 64 mL/min/1.73 (ref >59)

## 2024-11-27 LAB — HEMOGLOBIN A1C
Est. average glucose Bld gHb Est-mCnc: 123 mg/dL
Hgb A1c MFr Bld: 5.9 % — ABNORMAL HIGH (ref 4.8–5.6)

## 2024-11-27 LAB — CBC WITH DIFFERENTIAL/PLATELET
Basophils Absolute: 0 x10E3/uL (ref 0.0–0.2)
Basos: 1 %
EOS (ABSOLUTE): 0.1 x10E3/uL (ref 0.0–0.4)
Eos: 2 %
Hematocrit: 43.8 % (ref 34.0–46.6)
Hemoglobin: 14.2 g/dL (ref 11.1–15.9)
Immature Grans (Abs): 0 x10E3/uL (ref 0.0–0.1)
Immature Granulocytes: 0 %
Lymphocytes Absolute: 2.5 x10E3/uL (ref 0.7–3.1)
Lymphs: 41 %
MCH: 31.1 pg (ref 26.6–33.0)
MCHC: 32.4 g/dL (ref 31.5–35.7)
MCV: 96 fL (ref 79–97)
Monocytes Absolute: 0.7 x10E3/uL (ref 0.1–0.9)
Monocytes: 12 %
Neutrophils Absolute: 2.8 x10E3/uL (ref 1.4–7.0)
Neutrophils: 44 %
Platelets: 206 x10E3/uL (ref 150–450)
RBC: 4.57 x10E6/uL (ref 3.77–5.28)
RDW: 13.6 % (ref 11.7–15.4)
WBC: 6.2 x10E3/uL (ref 3.4–10.8)

## 2024-11-27 LAB — LIPID PANEL
Chol/HDL Ratio: 2.4 ratio (ref 0.0–4.4)
Cholesterol, Total: 124 mg/dL (ref 100–199)
HDL: 52 mg/dL (ref >39)
LDL Chol Calc (NIH): 60 mg/dL (ref 0–99)
Triglycerides: 52 mg/dL (ref 0–149)
VLDL Cholesterol Cal: 12 mg/dL (ref 5–40)

## 2024-11-27 LAB — TSH: TSH: 3.96 u[IU]/mL (ref 0.450–4.500)

## 2024-11-29 ENCOUNTER — Encounter: Payer: Self-pay | Admitting: Cardiology

## 2024-11-29 ENCOUNTER — Ambulatory Visit: Admitting: Cardiology

## 2024-11-29 VITALS — BP 124/74 | HR 80 | Ht 64.0 in | Wt 195.2 lb

## 2024-11-29 DIAGNOSIS — E782 Mixed hyperlipidemia: Secondary | ICD-10-CM | POA: Diagnosis not present

## 2024-11-29 DIAGNOSIS — R7303 Prediabetes: Secondary | ICD-10-CM

## 2024-11-29 DIAGNOSIS — M79605 Pain in left leg: Secondary | ICD-10-CM | POA: Diagnosis not present

## 2024-11-29 DIAGNOSIS — I10 Essential (primary) hypertension: Secondary | ICD-10-CM | POA: Diagnosis not present

## 2024-11-29 DIAGNOSIS — K921 Melena: Secondary | ICD-10-CM | POA: Diagnosis not present

## 2024-11-29 NOTE — Progress Notes (Signed)
 Established Patient Office Visit  Subjective:  Patient ID: Cristina Roberts, female    DOB: July 17, 1946  Age: 78 y.o. MRN: [id number]  Chief Complaint  Patient presents with   Follow-up    4 month lab results follow up. Shooting pain down left leg since Saturday. Stilll     Patient in office for 4 month follow up, discuss recent lab results. Patient doing well overall. Complains of left lower leg pain that started 5 days ago. Reports pain is in her calf area, rates it a 10. States she took Tylenol  last night which dulled the pain, pain worse today. Patient reports not being able to walk on left leg when the pain started last Saturday. Will order a STAT venous doppler to rule out a DVT.  Discussed recent lab work. LDL at goal. Hgb A1c stable. Kidney function normal. Blood count normal.  Patient referred to GI in 07/2024 for blood in stool. Patient reports not receiving a call to schedule an appointment. Patient reports she continues to have blood in her stool. Blood count normal. Will resend referral.  Blood pressure controlled today. Continue current medications.   Leg Pain  The incident occurred 3 to 5 days ago. The incident occurred at home. There was no injury mechanism. The pain is present in the left leg. The pain is at a severity of 10/10. The pain is severe. The pain has been Constant since onset. Associated symptoms include an inability to bear weight. Pertinent negatives include no numbness or tingling. She reports no foreign bodies present. The symptoms are aggravated by movement and weight bearing. She has tried acetaminophen  for the symptoms. The treatment provided mild relief.    No other concerns at this time.   Past Medical History:  Diagnosis Date   Acute appendicitis 06/29/2024   Arthritis    Osteoarthritis and Rheumatoid    Cancer (HCC)    Lung    Chronic kidney disease    Heart disease    Hyperlipidemia    Hypertension     Past Surgical History:   Procedure Laterality Date   COLONOSCOPY WITH PROPOFOL  N/A 02/07/2018   Procedure: COLONOSCOPY WITH PROPOFOL ;  Surgeon: Gaylyn Gladis PENNER, MD;  Location: [facility] ENDOSCOPY;  Service: Endoscopy;  Laterality: N/A;   fatty tumor removed     LAPAROSCOPIC APPENDECTOMY  06/29/2024   Procedure: APPENDECTOMY, LAPAROSCOPIC;  Surgeon: Marinda Jayson KIDD, MD;  Location: [facility] ORS;  Service: General;;   TOTAL HIP ARTHROPLASTY Right 09/10/2015   Procedure: TOTAL HIP ARTHROPLASTY;  Surgeon: Kayla Pinal, MD;  Location: [facility] ORS;  Service: Orthopedics;  Laterality: Right;   TUBAL LIGATION      Social History   Socioeconomic History   Marital status: Divorced    Spouse name: Not on file   Number of children: Not on file   Years of education: Not on file   Highest education level: Not on file  Occupational History   Not on file  Tobacco Use   Smoking status: Never   Smokeless tobacco: Never  Vaping Use   Vaping status: Never Used  Substance and Sexual Activity   Alcohol use: No    Alcohol/week: 0.0 standard drinks of alcohol   Drug use: No   Sexual activity: Not on file  Other Topics Concern   Not on file  Social History Narrative   Not on file   Social Drivers of Health   Tobacco Use: Low Risk (11/29/2024)   Patient History    Smoking Tobacco  Use: Never    Smokeless Tobacco Use: Never    Passive Exposure: Not on file  Financial Resource Strain: Not on file  Food Insecurity: No Food Insecurity (06/30/2024)   Epic    Worried About Programme Researcher, Broadcasting/film/video in the Last Year: Never true    Ran Out of Food in the Last Year: Never true  Transportation Needs: No Transportation Needs (06/30/2024)   Epic    Lack of Transportation (Medical): No    Lack of Transportation (Non-Medical): No  Physical Activity: Not on file  Stress: Not on file  Social Connections: Not on file  Intimate Partner Violence: Not At Risk (06/30/2024)   Epic    Fear of Current or Ex-Partner: No    Emotionally Abused: No     Physically Abused: No    Sexually Abused: No  Depression (PHQ2-9): Not on file  Alcohol Screen: Not on file  Housing: Low Risk (06/30/2024)   Epic    Unable to Pay for Housing in the Last Year: No    Number of Times Moved in the Last Year: 0    Homeless in the Last Year: No  Utilities: Not At Risk (06/30/2024)   Epic    Threatened with loss of utilities: No  Health Literacy: Not on file    Family History  Problem Relation Age of Onset   Breast cancer Neg Hx     Allergies[1]  Show/hide medication list[2]  Review of Systems  Constitutional: Negative.   HENT: Negative.    Eyes: Negative.   Respiratory: Negative.  Negative for shortness of breath.   Cardiovascular: Negative.  Negative for chest pain and leg swelling.  Gastrointestinal: Negative.  Negative for abdominal pain, constipation and diarrhea.  Genitourinary: Negative.   Musculoskeletal:  Negative for joint pain and myalgias.       Left lower leg pain  Skin: Negative.   Neurological: Negative.  Negative for dizziness, tingling, numbness and headaches.  Endo/Heme/Allergies: Negative.   Psychiatric/Behavioral: Negative.    All other systems reviewed and are negative.      Objective:   BP 124/74   Pulse 80   Ht 5' 4 (1.626 m)   Wt 195 lb 3.2 oz (88.5 kg)   SpO2 97%   BMI 33.51 kg/m   Vitals:   11/29/24 0854  BP: 124/74  Pulse: 80  Height: 5' 4 (1.626 m)  Weight: 195 lb 3.2 oz (88.5 kg)  SpO2: 97%  BMI (Calculated): 33.49    Physical Exam Vitals and nursing note reviewed.  Constitutional:      Appearance: Normal appearance. She is normal weight.  HENT:     Head: Normocephalic and atraumatic.     Nose: Nose normal.     Mouth/Throat:     Mouth: Mucous membranes are moist.  Eyes:     Extraocular Movements: Extraocular movements intact.     Conjunctiva/sclera: Conjunctivae normal.     Pupils: Pupils are equal, round, and reactive to light.  Cardiovascular:     Rate and Rhythm: Normal rate and  regular rhythm.     Pulses: Normal pulses.     Heart sounds: Murmur heard.  Pulmonary:     Effort: Pulmonary effort is normal.     Breath sounds: Normal breath sounds.  Abdominal:     General: Abdomen is flat. Bowel sounds are normal.     Palpations: Abdomen is soft.  Musculoskeletal:        General: No swelling. Normal range of motion.  Cervical back: Normal range of motion.     Right lower leg: No edema.     Left lower leg: No edema.  Skin:    General: Skin is warm and dry.  Neurological:     General: No focal deficit present.     Mental Status: She is alert and oriented to person, place, and time.  Psychiatric:        Mood and Affect: Mood normal.        Behavior: Behavior normal.        Thought Content: Thought content normal.        Judgment: Judgment normal.      No results found for any visits on 11/29/24.  Recent Results (from the past 2160 hours)  CBC with Differential/Platelet     Status: None   Collection Time: 11/26/24  8:56 AM  Result Value Ref Range   WBC 6.2 3.4 - 10.8 x10E3/uL   RBC 4.57 3.77 - 5.28 x10E6/uL   Hemoglobin 14.2 11.1 - 15.9 g/dL   Hematocrit 56.1 65.9 - 46.6 %   MCV 96 79 - 97 fL   MCH 31.1 26.6 - 33.0 pg   MCHC 32.4 31.5 - 35.7 g/dL   RDW 86.3 88.2 - 84.5 %   Platelets 206 150 - 450 x10E3/uL   Neutrophils 44 Not Estab. %   Lymphs 41 Not Estab. %   Monocytes 12 Not Estab. %   Eos 2 Not Estab. %   Basos 1 Not Estab. %   Neutrophils Absolute 2.8 1.4 - 7.0 x10E3/uL   Lymphocytes Absolute 2.5 0.7 - 3.1 x10E3/uL   Monocytes Absolute 0.7 0.1 - 0.9 x10E3/uL   EOS (ABSOLUTE) 0.1 0.0 - 0.4 x10E3/uL   Basophils Absolute 0.0 0.0 - 0.2 x10E3/uL   Immature Granulocytes 0 Not Estab. %   Immature Grans (Abs) 0.0 0.0 - 0.1 x10E3/uL  TSH     Status: None   Collection Time: 11/26/24  8:56 AM  Result Value Ref Range   TSH 3.960 0.450 - 4.500 uIU/mL  CMP14+EGFR     Status: Abnormal   Collection Time: 11/26/24  8:56 AM  Result Value Ref Range    Glucose 104 (H) 70 - 99 mg/dL   BUN 18 8 - 27 mg/dL   Creatinine, Ser 9.07 0.57 - 1.00 mg/dL   eGFR 64 >40 fO/fpw/8.26   BUN/Creatinine Ratio 20 12 - 28   Sodium 136 134 - 144 mmol/L   Potassium 4.1 3.5 - 5.2 mmol/L   Chloride 100 96 - 106 mmol/L   CO2 24 20 - 29 mmol/L   Calcium  9.6 8.7 - 10.3 mg/dL   Total Protein 7.0 6.0 - 8.5 g/dL   Albumin 4.1 3.8 - 4.8 g/dL   Globulin, Total 2.9 1.5 - 4.5 g/dL   Bilirubin Total 0.3 0.0 - 1.2 mg/dL   Alkaline Phosphatase 55 49 - 135 IU/L   AST 23 0 - 40 IU/L   ALT 24 0 - 32 IU/L  Hemoglobin A1c     Status: Abnormal   Collection Time: 11/26/24  8:56 AM  Result Value Ref Range   Hgb A1c MFr Bld 5.9 (H) 4.8 - 5.6 %    Comment:          Prediabetes: 5.7 - 6.4          Diabetes: >6.4          Glycemic control for adults with diabetes: <7.0    Est. average glucose Bld gHb Est-mCnc  123 mg/dL  Lipid panel     Status: None   Collection Time: 11/26/24  8:56 AM  Result Value Ref Range   Cholesterol, Total 124 100 - 199 mg/dL   Triglycerides 52 0 - 149 mg/dL   HDL 52 >60 mg/dL   VLDL Cholesterol Cal 12 5 - 40 mg/dL   LDL Chol Calc (NIH) 60 0 - 99 mg/dL   Chol/HDL Ratio 2.4 0.0 - 4.4 ratio    Comment:                                   T. Chol/HDL Ratio                                             Men  Women                               1/2 Avg.Risk  3.4    3.3                                   Avg.Risk  5.0    4.4                                2X Avg.Risk  9.6    7.1                                3X Avg.Risk 23.4   11.0       Assessment & Plan:  STAT venous doppler to rule out DVT Referral resent to GI Continue current medications  Problem List Items Addressed This Visit       Cardiovascular and Mediastinum   Essential hypertension, benign - Primary     Other   Mixed hyperlipidemia   Prediabetes   Blood in stool   Relevant Orders   Ambulatory referral to Gastroenterology   Acute leg pain, left   Relevant Orders   VAS US   LOWER EXTREMITY VENOUS (DVT)    Return in about 4 months (around 03/30/2025) for fasting lab work prior.   Total time spent: 25 minutes. This time includes review of previous notes and results and patient face to face interaction during today's visit.    Jeoffrey Pollen, NP  11/29/2024   This document may have been prepared by Dragon Voice Recognition software and as such may include unintentional dictation errors.     [1] No Known Allergies [2]  Outpatient Medications Prior to Visit  Medication Sig   amLODipine  (NORVASC ) 5 MG tablet Take 1 tablet (5 mg total) by mouth daily.   furosemide  (LASIX ) 20 MG tablet TAKE 1 TABLET BY MOUTH EVERY DAY   lisinopril -hydrochlorothiazide  (ZESTORETIC ) 20-25 MG tablet TAKE 1 TABLET BY MOUTH EVERY DAY   rosuvastatin (CRESTOR) 20 MG tablet TAKE 1 TABLET BY MOUTH EVERY DAY   spironolactone  (ALDACTONE ) 25 MG tablet TAKE 1 TABLET BY MOUTH EVERY DAY   Vitamin D , Ergocalciferol , (DRISDOL ) 1.25 MG (50000 UNIT) CAPS capsule Take 1 capsule (50,000 Units total) by mouth every 7 (seven) days.   No facility-administered  medications prior to visit.

## 2024-12-04 ENCOUNTER — Ambulatory Visit

## 2024-12-04 ENCOUNTER — Ambulatory Visit: Payer: Self-pay | Admitting: Cardiology

## 2024-12-04 DIAGNOSIS — M79605 Pain in left leg: Secondary | ICD-10-CM

## 2025-01-01 ENCOUNTER — Telehealth: Payer: Self-pay

## 2025-01-01 NOTE — Telephone Encounter (Signed)
 [facility] called asking for refills on spirololactone, lasix , rosuvastatin, IBU, aspirin ,  We need to call the patient and confirm that she wants the prescriptions sent here as she has not called and asked for the change

## 2025-01-07 ENCOUNTER — Other Ambulatory Visit: Payer: Self-pay

## 2025-01-07 ENCOUNTER — Encounter: Payer: Self-pay | Admitting: Cardiology

## 2025-01-07 ENCOUNTER — Ambulatory Visit: Payer: Medicare (Managed Care) | Admitting: Cardiology

## 2025-01-07 VITALS — BP 142/76 | HR 62 | Ht 64.0 in | Wt 200.0 lb

## 2025-01-07 DIAGNOSIS — I10 Essential (primary) hypertension: Secondary | ICD-10-CM

## 2025-01-07 DIAGNOSIS — R609 Edema, unspecified: Secondary | ICD-10-CM

## 2025-01-07 DIAGNOSIS — E782 Mixed hyperlipidemia: Secondary | ICD-10-CM | POA: Diagnosis not present

## 2025-01-07 DIAGNOSIS — I5033 Acute on chronic diastolic (congestive) heart failure: Secondary | ICD-10-CM

## 2025-01-07 DIAGNOSIS — R7303 Prediabetes: Secondary | ICD-10-CM

## 2025-01-07 DIAGNOSIS — I5189 Other ill-defined heart diseases: Secondary | ICD-10-CM

## 2025-01-07 DIAGNOSIS — R0602 Shortness of breath: Secondary | ICD-10-CM

## 2025-01-07 DIAGNOSIS — E785 Hyperlipidemia, unspecified: Secondary | ICD-10-CM

## 2025-01-07 MED ORDER — SPIRONOLACTONE 25 MG PO TABS: 25.0000 mg | ORAL_TABLET | Freq: Every day | ORAL | 0 refills | Status: AC

## 2025-01-07 MED ORDER — LISINOPRIL-HYDROCHLOROTHIAZIDE 20-25 MG PO TABS: 1.0000 | ORAL_TABLET | Freq: Every day | ORAL | 3 refills | Status: AC

## 2025-01-07 MED ORDER — FUROSEMIDE 20 MG PO TABS: 20.0000 mg | ORAL_TABLET | Freq: Every day | ORAL | 3 refills | Status: AC

## 2025-01-07 MED ORDER — VITAMIN D (ERGOCALCIFEROL) 1.25 MG (50000 UNIT) PO CAPS: 50000.0000 [IU] | ORAL_CAPSULE | ORAL | 3 refills | Status: AC

## 2025-01-07 MED ORDER — AMLODIPINE BESYLATE 5 MG PO TABS: 5.0000 mg | ORAL_TABLET | Freq: Every day | ORAL | 3 refills | Status: AC

## 2025-01-07 MED ORDER — ROSUVASTATIN CALCIUM 20 MG PO TABS: 20.0000 mg | ORAL_TABLET | Freq: Every day | ORAL | 1 refills | Status: AC

## 2025-01-07 NOTE — Progress Notes (Signed)
 "  Established Patient Office Visit  Subjective:  Patient ID: Cristina Roberts, female    DOB: 09/27/1946  Age: 79 y.o. MRN: [id number]  Chief Complaint  Patient presents with   Follow-up    Pt. 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 Patient is wanting to do this again. Patient states she will not be lifting patients or doing any strenuous activity. Letter printed for patient to proceed with helping patients.  No other concerns today.     No other concerns at this time.   Past Medical History:  Diagnosis Date   Acute appendicitis 06/29/2024   Arthritis    Osteoarthritis and Rheumatoid    Cancer (HCC)    Lung    Chronic kidney disease    Heart disease    Hyperlipidemia    Hypertension     Past Surgical History:  Procedure Laterality Date   COLONOSCOPY WITH PROPOFOL  N/A 02/07/2018   Procedure: COLONOSCOPY WITH PROPOFOL ;  Surgeon: Gaylyn Gladis PENNER, MD;  Location: [facility] ENDOSCOPY;  Service: Endoscopy;  Laterality: N/A;   fatty tumor removed     LAPAROSCOPIC APPENDECTOMY  06/29/2024   Procedure: APPENDECTOMY, LAPAROSCOPIC;  Surgeon: Marinda Jayson KIDD, MD;  Location: [facility] ORS;  Service: General;;   TOTAL HIP ARTHROPLASTY Right 09/10/2015   Procedure: TOTAL HIP ARTHROPLASTY;  Surgeon: Kayla Pinal, MD;  Location: [facility] ORS;  Service: Orthopedics;  Laterality: Right;   TUBAL LIGATION      Social History   Socioeconomic History   Marital status: Divorced    Spouse name: Not on file   Number of children: Not on file   Years of education: Not on file   Highest education level: Not on file  Occupational History   Not on file  Tobacco Use   Smoking status: Never   Smokeless tobacco: Never  Vaping Use   Vaping status: Never Used  Substance and Sexual Activity   Alcohol use: No     Alcohol/week: 0.0 standard drinks of alcohol   Drug use: No   Sexual activity: Not on file  Other Topics Concern   Not on file  Social History Narrative   Not on file   Social Drivers of Health   Tobacco Use: Low Risk (01/07/2025)   Patient History    Smoking Tobacco Use: Never    Smokeless Tobacco Use: Never    Passive Exposure: Not on file  Financial Resource Strain: High Risk (12/25/2024)   Received from [facility]   Overall Financial Resource Strain (CARDIA)    Difficulty of Paying Living Expenses: Hard  Food Insecurity: Food Insecurity Present (12/25/2024)   Received from [facility]   Epic    Within the past 12 months, you worried that your food would run out before you got the money to buy more.: Often true    Within the past 12 months, the food you bought just didn't last and you didn't have money to get more.: Often true  Transportation Needs: No Transportation Needs (12/25/2024)   Received from [facility] - Transportation    In the past 12 months, has lack of transportation kept you from medical appointments or from getting medications?: No    Lack of Transportation (Non-Medical): No  Physical Activity:  Not on file  Stress: Not on file  Social Connections: Not on file  Intimate Partner Violence: Not At Risk (06/30/2024)   Epic    Fear of Current or Ex-Partner: No    Emotionally Abused: No    Physically Abused: No    Sexually Abused: No  Depression (PHQ2-9): Not on file  Alcohol Screen: Not on file  Housing: Low Risk  (12/25/2024)   Received from [facility]   Epic    In the last 12 months, was there a time when you were not able to pay the mortgage or rent on time?: No    In the past 12 months, how many times have you moved where you were living?: 0    At any time in the past 12 months, were you homeless or living in a shelter (including now)?: No  Utilities: At Risk (12/25/2024)    Received from [facility]   Epic    In the past 12 months has the electric, gas, oil, or water company threatened to shut off services in your home?: Yes  Health Literacy: Not on file    Family History  Problem Relation Age of Onset   Breast cancer Neg Hx     Allergies[1]  Show/hide medication list[2]  Review of Systems  Constitutional: Negative.   HENT: Negative.    Eyes: Negative.   Respiratory: Negative.  Negative for shortness of breath.   Cardiovascular: Negative.  Negative for chest pain.  Gastrointestinal: Negative.  Negative for abdominal pain, constipation and diarrhea.  Genitourinary: Negative.   Musculoskeletal:  Negative for joint pain and myalgias.  Skin: Negative.   Neurological: Negative.  Negative for dizziness and headaches.  Endo/Heme/Allergies: Negative.   All other systems reviewed and are negative.      Objective:   BP (!) 142/76   Pulse 62   Ht 5' 4 (1.626 m)   Wt 200 lb (90.7 kg)   SpO2 95%   BMI 34.33 kg/m   Vitals:   01/07/25 1006  BP: (!) 142/76  Pulse: 62  Height: 5' 4 (1.626 m)  Weight: 200 lb (90.7 kg)  SpO2: 95%  BMI (Calculated): 34.31    Physical Exam Vitals and nursing note reviewed.  Constitutional:      Appearance: Normal appearance. She is normal weight.  HENT:     Head: Normocephalic and atraumatic.     Nose: Nose normal.     Mouth/Throat:     Mouth: Mucous membranes are moist.  Eyes:     Extraocular Movements: Extraocular movements intact.     Conjunctiva/sclera: Conjunctivae normal.     Pupils: Pupils are equal, round, and reactive to light.  Cardiovascular:     Rate and Rhythm: Normal rate and regular rhythm.     Pulses: Normal pulses.     Heart sounds: Normal heart sounds.  Pulmonary:     Effort: Pulmonary effort is normal.     Breath sounds: Normal breath sounds.  Abdominal:     General: Abdomen is flat. Bowel sounds are normal.     Palpations: Abdomen is soft.  Musculoskeletal:         General: Normal range of motion.     Cervical back: Normal range of motion.  Skin:    General: Skin is warm and dry.  Neurological:     General: No focal deficit present.     Mental Status: She is alert and oriented to person, place, and time.  Psychiatric:  Mood and Affect: Mood normal.        Behavior: Behavior normal.        Thought Content: Thought content normal.        Judgment: Judgment normal.      No results found for any visits on 01/07/25.  Recent Results (from the past 2160 hours)  CBC with Differential/Platelet     Status: None   Collection Time: 11/26/24  8:56 AM  Result Value Ref Range   WBC 6.2 3.4 - 10.8 x10E3/uL   RBC 4.57 3.77 - 5.28 x10E6/uL   Hemoglobin 14.2 11.1 - 15.9 g/dL   Hematocrit 56.1 65.9 - 46.6 %   MCV 96 79 - 97 fL   MCH 31.1 26.6 - 33.0 pg   MCHC 32.4 31.5 - 35.7 g/dL   RDW 86.3 88.2 - 84.5 %   Platelets 206 150 - 450 x10E3/uL   Neutrophils 44 Not Estab. %   Lymphs 41 Not Estab. %   Monocytes 12 Not Estab. %   Eos 2 Not Estab. %   Basos 1 Not Estab. %   Neutrophils Absolute 2.8 1.4 - 7.0 x10E3/uL   Lymphocytes Absolute 2.5 0.7 - 3.1 x10E3/uL   Monocytes Absolute 0.7 0.1 - 0.9 x10E3/uL   EOS (ABSOLUTE) 0.1 0.0 - 0.4 x10E3/uL   Basophils Absolute 0.0 0.0 - 0.2 x10E3/uL   Immature Granulocytes 0 Not Estab. %   Immature Grans (Abs) 0.0 0.0 - 0.1 x10E3/uL  TSH     Status: None   Collection Time: 11/26/24  8:56 AM  Result Value Ref Range   TSH 3.960 0.450 - 4.500 uIU/mL  CMP14+EGFR     Status: Abnormal   Collection Time: 11/26/24  8:56 AM  Result Value Ref Range   Glucose 104 (H) 70 - 99 mg/dL   BUN 18 8 - 27 mg/dL   Creatinine, Ser 9.07 0.57 - 1.00 mg/dL   eGFR 64 >40 fO/fpw/8.26   BUN/Creatinine Ratio 20 12 - 28   Sodium 136 134 - 144 mmol/L   Potassium 4.1 3.5 - 5.2 mmol/L   Chloride 100 96 - 106 mmol/L   CO2 24 20 - 29 mmol/L   Calcium  9.6 8.7 - 10.3 mg/dL   Total Protein 7.0 6.0 - 8.5 g/dL   Albumin 4.1 3.8 - 4.8  g/dL   Globulin, Total 2.9 1.5 - 4.5 g/dL   Bilirubin Total 0.3 0.0 - 1.2 mg/dL   Alkaline Phosphatase 55 49 - 135 IU/L   AST 23 0 - 40 IU/L   ALT 24 0 - 32 IU/L  Hemoglobin A1c     Status: Abnormal   Collection Time: 11/26/24  8:56 AM  Result Value Ref Range   Hgb A1c MFr Bld 5.9 (H) 4.8 - 5.6 %    Comment:          Prediabetes: 5.7 - 6.4          Diabetes: >6.4          Glycemic control for adults with diabetes: <7.0    Est. average glucose Bld gHb Est-mCnc 123 mg/dL  Lipid panel     Status: None   Collection Time: 11/26/24  8:56 AM  Result Value Ref Range   Cholesterol, Total 124 100 - 199 mg/dL   Triglycerides 52 0 - 149 mg/dL   HDL 52 >60 mg/dL   VLDL Cholesterol Cal 12 5 - 40 mg/dL   LDL Chol Calc (NIH) 60 0 - 99 mg/dL   Chol/HDL  Ratio 2.4 0.0 - 4.4 ratio    Comment:                                   T. Chol/HDL Ratio                                             Men  Women                               1/2 Avg.Risk  3.4    3.3                                   Avg.Risk  5.0    4.4                                2X Avg.Risk  9.6    7.1                                3X Avg.Risk 23.4   11.0       Assessment & Plan:  Letter printed for patient to proceed with helping patients  Problem List Items Addressed This Visit       Cardiovascular and Mediastinum   Essential hypertension, benign - Primary     Other   Mixed hyperlipidemia   Prediabetes    Return if symptoms worsen or fail to improve, for as scheduled in April.   Total time spent: 25 minutes. This time includes review of previous notes and results and patient face to face interaction during today's visit.    Jeoffrey Pollen, NP  01/07/2025   This document may have been prepared by Dragon Voice Recognition software and as such may include unintentional dictation errors.     [1] No Known Allergies [2]  Outpatient Medications Prior to Visit  Medication Sig   amLODipine  (NORVASC ) 5 MG tablet Take 1  tablet (5 mg total) by mouth daily.   furosemide  (LASIX ) 20 MG tablet TAKE 1 TABLET BY MOUTH EVERY DAY   lisinopril -hydrochlorothiazide  (ZESTORETIC ) 20-25 MG tablet TAKE 1 TABLET BY MOUTH EVERY DAY   rosuvastatin  (CRESTOR ) 20 MG tablet TAKE 1 TABLET BY MOUTH EVERY DAY   spironolactone  (ALDACTONE ) 25 MG tablet TAKE 1 TABLET BY MOUTH EVERY DAY   Vitamin D , Ergocalciferol , (DRISDOL ) 1.25 MG (50000 UNIT) CAPS capsule Take 1 capsule (50,000 Units total) by mouth every 7 (seven) days.   No facility-administered medications prior to visit.   "

## 2025-01-07 NOTE — Telephone Encounter (Signed)
 Patient verbally confirmed that she does want the medications sent to [facility]

## 2025-01-10 ENCOUNTER — Ambulatory Visit: Admitting: Cardiovascular Disease

## 2025-01-14 ENCOUNTER — Ambulatory Visit: Payer: Medicare (Managed Care) | Admitting: Cardiovascular Disease

## 2025-01-16 ENCOUNTER — Other Ambulatory Visit: Payer: Self-pay

## 2025-01-16 ENCOUNTER — Emergency Department: Payer: Medicare (Managed Care)

## 2025-01-16 ENCOUNTER — Encounter: Payer: Self-pay | Admitting: Emergency Medicine

## 2025-01-16 ENCOUNTER — Emergency Department
Admission: EM | Admit: 2025-01-16 | Discharge: 2025-01-16 | Disposition: A | Discharge status: Home / Self Care | Payer: Medicare (Managed Care)

## 2025-01-16 DIAGNOSIS — M25512 Pain in left shoulder: Secondary | ICD-10-CM | POA: Insufficient documentation

## 2025-01-16 MED ORDER — METHOCARBAMOL 500 MG PO TABS
500.0000 mg | ORAL_TABLET | Freq: Once | ORAL | Status: DC
  Filled 2025-01-16: qty 1

## 2025-01-16 MED ORDER — METHOCARBAMOL 500 MG PO TABS: 500.0000 mg | ORAL_TABLET | Freq: Three times a day (TID) | ORAL | 0 refills | Status: AC | PRN

## 2025-01-16 NOTE — ED Provider Notes (Signed)
 "  [facility] Provider Note    Event Date/Time   First MD Initiated Contact with Patient 01/16/25 1240     (approximate)   History   Shoulder Pain   HPI  Cristina Roberts is a 79 y.o. female who presents with concern of shoulder pain.  2 weeks ago developed left-sided shoulder pain, causing her to have difficulty with raising her arm above shoulder level, gradually worsening.  Yesterday also seemed to have a stiff neck and due to the pain was having some trouble sleeping.  She tried taking some Tylenol  for the pain without much improvement in symptoms.  Denies any associated chest pain shortness of breath lightheadedness.  No fevers chills.  Recently seen by cardiology I reviewed their notes.  Denies any known history of coronary artery disease.  Pain does not seem to worsen with exertion.  Denies any associated trauma or swelling.  Pain is limited to the left shoulder does not involve the elbow wrist forearm or hand     Physical Exam   Triage Vital Signs: ED Triage Vitals  Encounter Vitals Group     BP 01/16/25 1140 (!) 149/80     Girls Systolic BP Percentile --      Girls Diastolic BP Percentile --      Boys Systolic BP Percentile --      Boys Diastolic BP Percentile --      Pulse Rate 01/16/25 1140 68     Resp 01/16/25 1140 18     Temp 01/16/25 1140 98.1 F (36.7 C)     Temp Source 01/16/25 1140 Oral     SpO2 01/16/25 1140 98 %     Weight 01/16/25 1139 189 lb (85.7 kg)     Height 01/16/25 1139 5' 4 (1.626 m)     Head Circumference --      Peak Flow --      Pain Score 01/16/25 1138 10     Pain Loc --      Pain Education --      Exclude from Growth Chart --     Most recent vital signs: Vitals:   01/16/25 1140  BP: (!) 149/80  Pulse: 68  Resp: 18  Temp: 98.1 F (36.7 C)  SpO2: 98%     General: Awake, no distress.  CV:  Good peripheral perfusion.  No reproducible chest wall tenderness on exam Resp:  Normal effort.  Abd:  No  distention.  MSK:  No tenderness to palpation of the shoulder however when attempting to abduct the shoulder greater than 90 degrees, does cause significant pain primarily along the posterior shoulder, and then when attempting to externally rotate the shoulder also causing significant reproducible pain Other:     ED Results / Procedures / Treatments   Labs (all labs ordered are listed, but only abnormal results are displayed) Labs Reviewed - No data to display   EKG  My independent interpretation of this chest x-ray reveals a sinus rhythm with rate of about 65, axis of about 20, intervals appear to be within normal limits, no obvious ischemia appreciated this EKG   RADIOLOGY Appears to be a normal shoulder x-ray on my independent interpretation of this image  PROCEDURES:  Critical Care performed: No  Procedures   MEDICATIONS ORDERED IN ED: Medications  methocarbamol  (ROBAXIN ) tablet 500 mg (has no administration in time range)     IMPRESSION / MDM / ASSESSMENT AND PLAN / ED COURSE  I reviewed the triage  vital signs and the nursing notes.                               Patient's presentation is most consistent with acute, uncomplicated illness.  79 year old female who presents today with concern of left shoulder pain.  She appears well she is not in any acute distress her EKG is without acute findings.  Exam appears consistent with ligamentous injury, no associated trauma.  Given the presence of the shoulder pain I did consider possible ACS but clinical exam makes this seem very unlikely and EKG is reassuring, I believe reasonable to hold off on lab testing at this time.  I do not feel underlying DVT or other vascular etiology.  Recommending follow-up with physical therapy she does follow-up with her general provider tomorrow who can reassess.  Will give a brief course of muscle relaxers and have the patient discharged home at this time.       FINAL CLINICAL IMPRESSION(S)  / ED DIAGNOSES   Final diagnoses:  Acute pain of left shoulder     Rx / DC Orders   ED Discharge Orders          Ordered    methocarbamol  (ROBAXIN ) 500 MG tablet  Every 8 hours PRN        01/16/25 1252             Note:  This document was prepared using Dragon voice recognition software and may include unintentional dictation errors.   Fernand Rossie HERO, MD 01/16/25 1536  "

## 2025-01-16 NOTE — ED Triage Notes (Signed)
 Pt via POV from home. Pt c/o L shoulder pain for the past week. Denies injury. Worse with movement. Reports 10/10 pain. Pt is A&Ox4 and NAD, ambulatory to triage.

## 2025-01-16 NOTE — Discharge Instructions (Addendum)
 You were seen today due to concern of shoulder pain.  At this time I am concerned you have a ligamentous injury, you need to follow-up with the physical therapist, please schedule appointment using the phone number provided.  I have written for a brief course of muscle relaxers for you to take at home, please take these as instructed.  If you have any worsening symptoms such as severely increased pain chest pain shortness of breath lightheadedness or any other symptoms you find concerning please return to the emergency department immediately for further medical management.

## 2025-01-17 ENCOUNTER — Encounter: Payer: Self-pay | Admitting: Cardiology

## 2025-01-17 ENCOUNTER — Ambulatory Visit: Payer: Medicare (Managed Care) | Admitting: Cardiology

## 2025-01-17 VITALS — BP 112/68 | HR 76 | Ht 64.0 in | Wt 194.0 lb

## 2025-01-17 DIAGNOSIS — I5189 Other ill-defined heart diseases: Secondary | ICD-10-CM

## 2025-01-17 DIAGNOSIS — I38 Endocarditis, valve unspecified: Secondary | ICD-10-CM

## 2025-01-17 DIAGNOSIS — M25512 Pain in left shoulder: Secondary | ICD-10-CM | POA: Diagnosis not present

## 2025-01-17 DIAGNOSIS — I10 Essential (primary) hypertension: Secondary | ICD-10-CM | POA: Diagnosis not present

## 2025-01-17 NOTE — Progress Notes (Signed)
 "  Established Patient Office Visit  Subjective:  Patient ID: Cristina Roberts, female    DOB: 01/03/1946  Age: 79 y.o. MRN: [id number]  Chief Complaint  Patient presents with   Acute Visit    Patient in office for an acute visit, complaining of left shoulder pain. Patient went to ED yesterday for worsening shoulder pain. Was prescribed a muscle relaxer which has provided temporary relief. 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 Will send referral to PT. Patient unsure if she can do PT, will give her a handout on shoulder exercises to do at home.   Shoulder Pain  The pain is present in the left shoulder. This is a new problem. The current episode started in the past 7 days. There has been no history of extremity trauma. The problem occurs constantly. The problem has been unchanged. The quality of the pain is described as aching. The pain is mild. Associated symptoms include tingling. She has tried acetaminophen  and rest (Muscle relaxer) for the symptoms. The treatment provided no relief.    No other concerns at this time.   Past Medical History:  Diagnosis Date   Acute appendicitis 06/29/2024   Arthritis    Osteoarthritis and Rheumatoid    Cancer (HCC)    Lung    Chronic kidney disease    Heart disease    Hyperlipidemia    Hypertension     Past Surgical History:  Procedure Laterality Date   COLONOSCOPY WITH PROPOFOL  N/A 02/07/2018   Procedure: COLONOSCOPY WITH PROPOFOL ;  Surgeon: Gaylyn Gladis PENNER, MD;  Location: [facility] ENDOSCOPY;  Service: Endoscopy;  Laterality: N/A;   fatty tumor removed     LAPAROSCOPIC APPENDECTOMY  06/29/2024   Procedure: APPENDECTOMY, LAPAROSCOPIC;  Surgeon: Marinda Jayson KIDD, MD;  Location: [facility] ORS;  Service: General;;   TOTAL HIP ARTHROPLASTY Right 09/10/2015   Procedure: TOTAL HIP ARTHROPLASTY;  Surgeon: Kayla Pinal, MD;   Location: [facility] ORS;  Service: Orthopedics;  Laterality: Right;   TUBAL LIGATION      Social History   Socioeconomic History   Marital status: Divorced    Spouse name: Not on file   Number of children: Not on file   Years of education: Not on file   Highest education level: Not on file  Occupational History   Not on file  Tobacco Use   Smoking status: Never   Smokeless tobacco: Never  Vaping Use   Vaping status: Never Used  Substance and Sexual Activity   Alcohol use: No    Alcohol/week: 0.0 standard drinks of alcohol   Drug use: No   Sexual activity: Not on file  Other Topics Concern   Not on file  Social History Narrative   Not on file   Social Drivers of Health   Tobacco Use: Low Risk (01/17/2025)   Patient History    Smoking Tobacco Use: Never    Smokeless Tobacco Use: Never    Passive Exposure: Not on file  Financial Resource Strain: High Risk (12/25/2024)   Received [facility]   Overall Financial Resource Strain (CARDIA)    Difficulty of Paying Living Expenses: Hard  Food Insecurity: Food Insecurity Present (12/25/2024)   Received from [facility]   Epic    Within the past 12 months, you worried that your food would run out before you got the money to buy more.: Often true    Within the  past 12 months, the food you bought just didn't last and you didn't have money to get more.: Often true  Transportation Needs: No Transportation Needs (12/25/2024)   Received from [facility] - Transportation    In the past 12 months, has lack of transportation kept you from medical appointments or from getting medications?: No    Lack of Transportation (Non-Medical): No  Physical Activity: Not on file  Stress: Not on file  Social Connections: Not on file  Intimate Partner Violence: Not At Risk (06/30/2024)   Epic    Fear of Current or Ex-Partner: No    Emotionally Abused: No    Physically Abused: No     Sexually Abused: No  Depression (PHQ2-9): Not on file  Alcohol Screen: Not on file  Housing: Low Risk  (12/25/2024)   Received from [facility]   Epic    In the last 12 months, was there a time when you were not able to pay the mortgage or rent on time?: No    In the past 12 months, how many times have you moved where you were living?: 0    At any time in the past 12 months, were you homeless or living in a shelter (including now)?: No  Utilities: At Risk (12/25/2024)   Received from [facility]   Epic    In the past 12 months has the electric, gas, oil, or water company threatened to shut off services in your home?: Yes  Health Literacy: Not on file    Family History  Problem Relation Age of Onset   Breast cancer Neg Hx     Allergies[1]  Show/hide medication list[2]  Review of Systems  Constitutional: Negative.   HENT: Negative.    Eyes: Negative.   Respiratory: Negative.  Negative for shortness of breath.   Cardiovascular: Negative.  Negative for chest pain.  Gastrointestinal: Negative.  Negative for abdominal pain, constipation and diarrhea.  Genitourinary: Negative.   Musculoskeletal:  Positive for joint pain. Negative for myalgias.  Skin: Negative.   Neurological:  Positive for tingling. Negative for dizziness and headaches.  Endo/Heme/Allergies: Negative.   All other systems reviewed and are negative.      Objective:   BP 112/68   Pulse 76   Ht 5' 4 (1.626 m)   Wt 194 lb (88 kg)   SpO2 96%   BMI 33.30 kg/m   Vitals:   01/17/25 1042  BP: 112/68  Pulse: 76  Height: 5' 4 (1.626 m)  Weight: 194 lb (88 kg)  SpO2: 96%  BMI (Calculated): 33.28    Physical Exam Vitals and nursing note reviewed.  Constitutional:      Appearance: Normal appearance. She is normal weight.  HENT:     Head: Normocephalic and atraumatic.     Nose: Nose normal.     Mouth/Throat:     Mouth: Mucous membranes are moist.  Eyes:      Extraocular Movements: Extraocular movements intact.     Conjunctiva/sclera: Conjunctivae normal.     Pupils: Pupils are equal, round, and reactive to light.  Cardiovascular:     Rate and Rhythm: Normal rate and regular rhythm.     Pulses: Normal pulses.     Heart sounds: Normal heart sounds.  Pulmonary:     Effort: Pulmonary effort is normal.     Breath sounds: Normal breath sounds.  Abdominal:     General: Abdomen is flat. Bowel sounds  are normal.     Palpations: Abdomen is soft.  Musculoskeletal:        General: Normal range of motion.     Cervical back: Normal range of motion.  Skin:    General: Skin is warm and dry.  Neurological:     General: No focal deficit present.     Mental Status: She is alert and oriented to person, place, and time.  Psychiatric:        Mood and Affect: Mood normal.        Behavior: Behavior normal.        Thought Content: Thought content normal.        Judgment: Judgment normal.      No results found for any visits on 01/17/25.  Recent Results (from the past 2160 hours)  CBC with Differential/Platelet     Status: None   Collection Time: 11/26/24  8:56 AM  Result Value Ref Range   WBC 6.2 3.4 - 10.8 x10E3/uL   RBC 4.57 3.77 - 5.28 x10E6/uL   Hemoglobin 14.2 11.1 - 15.9 g/dL   Hematocrit 56.1 65.9 - 46.6 %   MCV 96 79 - 97 fL   MCH 31.1 26.6 - 33.0 pg   MCHC 32.4 31.5 - 35.7 g/dL   RDW 86.3 88.2 - 84.5 %   Platelets 206 150 - 450 x10E3/uL   Neutrophils 44 Not Estab. %   Lymphs 41 Not Estab. %   Monocytes 12 Not Estab. %   Eos 2 Not Estab. %   Basos 1 Not Estab. %   Neutrophils Absolute 2.8 1.4 - 7.0 x10E3/uL   Lymphocytes Absolute 2.5 0.7 - 3.1 x10E3/uL   Monocytes Absolute 0.7 0.1 - 0.9 x10E3/uL   EOS (ABSOLUTE) 0.1 0.0 - 0.4 x10E3/uL   Basophils Absolute 0.0 0.0 - 0.2 x10E3/uL   Immature Granulocytes 0 Not Estab. %   Immature Grans (Abs) 0.0 0.0 - 0.1 x10E3/uL  TSH     Status: None   Collection Time: 11/26/24  8:56 AM  Result  Value Ref Range   TSH 3.960 0.450 - 4.500 uIU/mL  CMP14+EGFR     Status: Abnormal   Collection Time: 11/26/24  8:56 AM  Result Value Ref Range   Glucose 104 (H) 70 - 99 mg/dL   BUN 18 8 - 27 mg/dL   Creatinine, Ser 9.07 0.57 - 1.00 mg/dL   eGFR 64 >40 fO/fpw/8.26   BUN/Creatinine Ratio 20 12 - 28   Sodium 136 134 - 144 mmol/L   Potassium 4.1 3.5 - 5.2 mmol/L   Chloride 100 96 - 106 mmol/L   CO2 24 20 - 29 mmol/L   Calcium  9.6 8.7 - 10.3 mg/dL   Total Protein 7.0 6.0 - 8.5 g/dL   Albumin 4.1 3.8 - 4.8 g/dL   Globulin, Total 2.9 1.5 - 4.5 g/dL   Bilirubin Total 0.3 0.0 - 1.2 mg/dL   Alkaline Phosphatase 55 49 - 135 IU/L   AST 23 0 - 40 IU/L   ALT 24 0 - 32 IU/L  Hemoglobin A1c     Status: Abnormal   Collection Time: 11/26/24  8:56 AM  Result Value Ref Range   Hgb A1c MFr Bld 5.9 (H) 4.8 - 5.6 %    Comment:          Prediabetes: 5.7 - 6.4          Diabetes: >6.4          Glycemic control for adults with diabetes: <  7.0    Est. average glucose Bld gHb Est-mCnc 123 mg/dL  Lipid panel     Status: None   Collection Time: 11/26/24  8:56 AM  Result Value Ref Range   Cholesterol, Total 124 100 - 199 mg/dL   Triglycerides 52 0 - 149 mg/dL   HDL 52 >60 mg/dL   VLDL Cholesterol Cal 12 5 - 40 mg/dL   LDL Chol Calc (NIH) 60 0 - 99 mg/dL   Chol/HDL Ratio 2.4 0.0 - 4.4 ratio    Comment:                                   T. Chol/HDL Ratio                                             Men  Women                               1/2 Avg.Risk  3.4    3.3                                   Avg.Risk  5.0    4.4                                2X Avg.Risk  9.6    7.1                                3X Avg.Risk 23.4   11.0       Assessment & Plan:  Referral sent to PT Handout for shoulder exercises given to patient  Problem List Items Addressed This Visit       Cardiovascular and Mediastinum   Essential hypertension, benign   Relevant Orders   Ambulatory referral to Cardiology   Heart  valve problem   Relevant Orders   Ambulatory referral to Cardiology     Other   Diastolic dysfunction   Relevant Orders   Ambulatory referral to Cardiology   Other Visit Diagnoses       Acute pain of left shoulder    -  Primary   Relevant Orders   Ambulatory referral to Physical Therapy       Return if symptoms worsen or fail to improve, for as scheduled .   Total time spent: 25 minutes. This time includes review of previous notes and results and patient face to face interaction during today's visit.    Jeoffrey Pollen, NP  01/17/2025   This document may have been prepared by [facility] Voice Recognition software and as such may include unintentional dictation errors.     [1] No Known Allergies [2]  Outpatient Medications Prior to Visit  Medication Sig   amLODipine  (NORVASC ) 5 MG tablet Take 1 tablet (5 mg total) by mouth daily.   furosemide  (LASIX ) 20 MG tablet Take 1 tablet (20 mg total) by mouth daily.   lisinopril -hydrochlorothiazide  (ZESTORETIC ) 20-25 MG tablet Take 1 tablet by mouth daily.   methocarbamol  (ROBAXIN ) 500 MG tablet Take 1 tablet (500 mg total) by mouth  every 8 (eight) hours as needed for muscle spasms.   rosuvastatin  (CRESTOR ) 20 MG tablet Take 1 tablet (20 mg total) by mouth daily.   spironolactone  (ALDACTONE ) 25 MG tablet Take 1 tablet (25 mg total) by mouth daily.   Vitamin D , Ergocalciferol , (DRISDOL ) 1.25 MG (50000 UNIT) CAPS capsule Take 1 capsule (50,000 Units total) by mouth every 7 (seven) days.   No facility-administered medications prior to visit.   "

## 2025-02-04 ENCOUNTER — Ambulatory Visit: Payer: Medicare (Managed Care) | Admitting: Cardiovascular Disease

## 2025-03-29 ENCOUNTER — Ambulatory Visit: Admitting: Cardiology

## 2025-04-09 ENCOUNTER — Ambulatory Visit: Payer: Medicare (Managed Care) | Admitting: Internal Medicine
